# Patient Record
Sex: Female | Born: 1940
Health system: Southern US, Community
[De-identification: ages and names within clinical notes are randomized; demographics above are authoritative.]

## PROBLEM LIST (undated history)

## (undated) DIAGNOSIS — N289 Disorder of kidney and ureter, unspecified: Secondary | ICD-10-CM

---

## 2005-03-18 ENCOUNTER — Ambulatory Visit: Payer: Self-pay | Admitting: Nurse Practitioner

## 2005-03-25 ENCOUNTER — Ambulatory Visit: Payer: Self-pay | Admitting: Nurse Practitioner

## 2005-03-30 ENCOUNTER — Ambulatory Visit: Payer: Self-pay | Admitting: *Deleted

## 2005-04-05 ENCOUNTER — Ambulatory Visit (HOSPITAL_COMMUNITY): Admission: RE | Admit: 2005-04-05 | Discharge: 2005-04-05 | Payer: Self-pay | Admitting: Family Medicine

## 2005-10-25 ENCOUNTER — Ambulatory Visit: Payer: Self-pay | Admitting: Nurse Practitioner

## 2005-11-15 ENCOUNTER — Ambulatory Visit: Payer: Self-pay | Admitting: Nurse Practitioner

## 2005-11-18 ENCOUNTER — Ambulatory Visit: Payer: Self-pay | Admitting: Nurse Practitioner

## 2008-11-01 ENCOUNTER — Ambulatory Visit: Payer: Self-pay | Admitting: Family Medicine

## 2008-11-01 ENCOUNTER — Encounter (INDEPENDENT_AMBULATORY_CARE_PROVIDER_SITE_OTHER): Payer: Self-pay | Admitting: Internal Medicine

## 2008-11-01 LAB — CONVERTED CEMR LAB
ALT: 9 units/L (ref 0–35)
AST: 14 units/L (ref 0–37)
Albumin: 4.3 g/dL (ref 3.5–5.2)
Alkaline Phosphatase: 73 units/L (ref 39–117)
Basophils Absolute: 0 10*3/uL (ref 0.0–0.1)
Basophils Relative: 1 % (ref 0–1)
CO2: 26 meq/L (ref 19–32)
Calcium: 9.6 mg/dL (ref 8.4–10.5)
Cholesterol: 205 mg/dL — ABNORMAL HIGH (ref 0–200)
Eosinophils Absolute: 0.1 10*3/uL (ref 0.0–0.7)
Glucose, Bld: 90 mg/dL (ref 70–99)
Hemoglobin: 13.1 g/dL (ref 12.0–15.0)
Lymphocytes Relative: 49 % — ABNORMAL HIGH (ref 12–46)
Lymphs Abs: 2.5 10*3/uL (ref 0.7–4.0)
MCHC: 32.3 g/dL (ref 30.0–36.0)
MCV: 86.8 fL (ref 78.0–100.0)
Neutro Abs: 2.3 10*3/uL (ref 1.7–7.7)
RBC: 4.68 M/uL (ref 3.87–5.11)
Sodium: 139 meq/L (ref 135–145)
Total Bilirubin: 0.6 mg/dL (ref 0.3–1.2)
Total Protein: 7.1 g/dL (ref 6.0–8.3)
VLDL: 19 mg/dL (ref 0–40)

## 2009-01-08 ENCOUNTER — Ambulatory Visit: Payer: Self-pay | Admitting: Internal Medicine

## 2009-04-30 ENCOUNTER — Ambulatory Visit: Payer: Self-pay | Admitting: Internal Medicine

## 2009-05-28 ENCOUNTER — Ambulatory Visit (HOSPITAL_COMMUNITY): Admission: RE | Admit: 2009-05-28 | Discharge: 2009-05-28 | Payer: Self-pay | Admitting: Internal Medicine

## 2009-05-28 ENCOUNTER — Ambulatory Visit: Payer: Self-pay | Admitting: Internal Medicine

## 2009-05-28 LAB — CONVERTED CEMR LAB
ALT: 13 units/L (ref 0–35)
Albumin: 1.7 g/dL — ABNORMAL LOW (ref 3.5–5.2)
Alkaline Phosphatase: 72 units/L (ref 39–117)
BUN: 45 mg/dL — ABNORMAL HIGH (ref 6–23)
Basophils Absolute: 0.1 10*3/uL (ref 0.0–0.1)
Basophils Relative: 1 % (ref 0–1)
Calcium: 7.6 mg/dL — ABNORMAL LOW (ref 8.4–10.5)
Eosinophils Absolute: 0.2 10*3/uL (ref 0.0–0.7)
Lymphocytes Relative: 44 % (ref 12–46)
Lymphs Abs: 2.7 10*3/uL (ref 0.7–4.0)
MCHC: 32.4 g/dL (ref 30.0–36.0)
Neutrophils Relative %: 47 % (ref 43–77)
Phosphorus: 5.2 mg/dL — ABNORMAL HIGH (ref 2.3–4.6)
Potassium: 3.7 meq/L (ref 3.5–5.3)
RBC: 3.8 M/uL — ABNORMAL LOW (ref 3.87–5.11)
Total Protein: 4.6 g/dL — ABNORMAL LOW (ref 6.0–8.3)

## 2009-05-29 ENCOUNTER — Ambulatory Visit (HOSPITAL_COMMUNITY): Admission: RE | Admit: 2009-05-29 | Discharge: 2009-05-29 | Payer: Self-pay | Admitting: Internal Medicine

## 2009-05-29 ENCOUNTER — Encounter (INDEPENDENT_AMBULATORY_CARE_PROVIDER_SITE_OTHER): Payer: Self-pay | Admitting: Internal Medicine

## 2009-05-29 ENCOUNTER — Ambulatory Visit: Payer: Self-pay | Admitting: Internal Medicine

## 2009-06-04 ENCOUNTER — Encounter: Payer: Self-pay | Admitting: Internal Medicine

## 2009-06-04 ENCOUNTER — Inpatient Hospital Stay (HOSPITAL_COMMUNITY): Admission: RE | Admit: 2009-06-04 | Discharge: 2009-06-11 | Payer: Self-pay | Admitting: Infectious Diseases

## 2009-06-04 ENCOUNTER — Ambulatory Visit: Payer: Self-pay | Admitting: Infectious Diseases

## 2009-06-06 ENCOUNTER — Encounter: Payer: Self-pay | Admitting: Internal Medicine

## 2009-06-11 ENCOUNTER — Encounter: Payer: Self-pay | Admitting: Internal Medicine

## 2009-06-11 DIAGNOSIS — D649 Anemia, unspecified: Secondary | ICD-10-CM | POA: Insufficient documentation

## 2009-06-11 DIAGNOSIS — N049 Nephrotic syndrome with unspecified morphologic changes: Secondary | ICD-10-CM | POA: Insufficient documentation

## 2009-06-11 DIAGNOSIS — E785 Hyperlipidemia, unspecified: Secondary | ICD-10-CM | POA: Insufficient documentation

## 2009-06-11 DIAGNOSIS — I10 Essential (primary) hypertension: Secondary | ICD-10-CM | POA: Insufficient documentation

## 2009-06-13 ENCOUNTER — Ambulatory Visit: Payer: Self-pay | Admitting: Infectious Diseases

## 2009-06-13 LAB — CONVERTED CEMR LAB
Basophils Relative: 0 % (ref 0–1)
Calcium: 8 mg/dL — ABNORMAL LOW (ref 8.4–10.5)
Chloride: 106 meq/L (ref 96–112)
Creatinine, Ser: 3.17 mg/dL — ABNORMAL HIGH (ref 0.40–1.20)
Eosinophils Relative: 0 % (ref 0–5)
Lymphocytes Relative: 11 % — ABNORMAL LOW (ref 12–46)
MCHC: 32.2 g/dL (ref 30.0–36.0)
MCV: 87.1 fL (ref 78.0–100.0)
Monocytes Absolute: 0.2 10*3/uL (ref 0.1–1.0)
Neutro Abs: 9 10*3/uL — ABNORMAL HIGH (ref 1.7–7.7)
Potassium: 2.9 meq/L — ABNORMAL LOW (ref 3.5–5.3)
RDW: 15.5 % (ref 11.5–15.5)
WBC: 10.4 10*3/uL (ref 4.0–10.5)

## 2009-06-17 ENCOUNTER — Ambulatory Visit: Payer: Self-pay | Admitting: Internal Medicine

## 2009-06-17 LAB — CONVERTED CEMR LAB
BUN: 60 mg/dL — ABNORMAL HIGH (ref 6–23)
CO2: 23 meq/L (ref 19–32)
Hemoglobin: 10.5 g/dL — ABNORMAL LOW (ref 12.0–15.0)
Lymphocytes Relative: 12 % (ref 12–46)
Lymphs Abs: 1.2 10*3/uL (ref 0.7–4.0)
MCV: 85.8 fL (ref 78.0–100.0)
Monocytes Absolute: 0.5 10*3/uL (ref 0.1–1.0)
Monocytes Relative: 5 % (ref 3–12)
Neutrophils Relative %: 83 % — ABNORMAL HIGH (ref 43–77)
Platelets: 344 10*3/uL (ref 150–400)
RBC: 3.63 M/uL — ABNORMAL LOW (ref 3.87–5.11)
Sodium: 140 meq/L (ref 135–145)
WBC: 10 10*3/uL (ref 4.0–10.5)

## 2009-06-25 ENCOUNTER — Ambulatory Visit: Payer: Self-pay | Admitting: Internal Medicine

## 2009-06-25 LAB — CONVERTED CEMR LAB
Eosinophils Absolute: 0 10*3/uL (ref 0.0–0.7)
HCT: 34.8 % — ABNORMAL LOW (ref 36.0–46.0)
Lymphocytes Relative: 9 % — ABNORMAL LOW (ref 12–46)
MCV: 87.4 fL (ref 78.0–100.0)
Monocytes Absolute: 0.3 10*3/uL (ref 0.1–1.0)
Neutro Abs: 8.7 10*3/uL — ABNORMAL HIGH (ref 1.7–7.7)
RBC: 3.98 M/uL (ref 3.87–5.11)
Retic Ct Pct: 1.5 % (ref 0.4–3.1)
WBC: 9.9 10*3/uL (ref 4.0–10.5)

## 2009-07-03 ENCOUNTER — Ambulatory Visit: Payer: Self-pay | Admitting: Internal Medicine

## 2009-12-03 ENCOUNTER — Ambulatory Visit: Payer: Self-pay | Admitting: Internal Medicine

## 2011-02-04 IMAGING — US US RENAL
1 series · 14 of 25 positions shown · non-contrast
Comparison: None

CLINICAL DATA: Acute renal failure

RENAL/URINARY TRACT ULTRASOUND COMPLETE

[Series 1: us renal · 0.28mm/px · 14 of 26 slices shown]
[im 1/26]
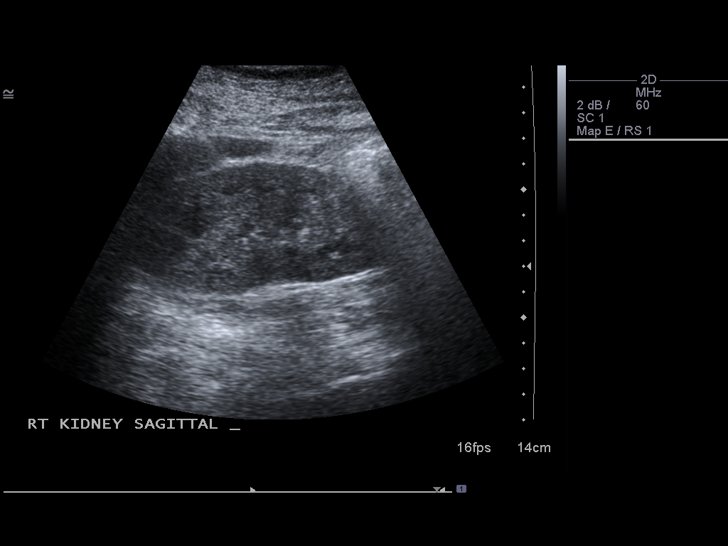
[im 3/26]
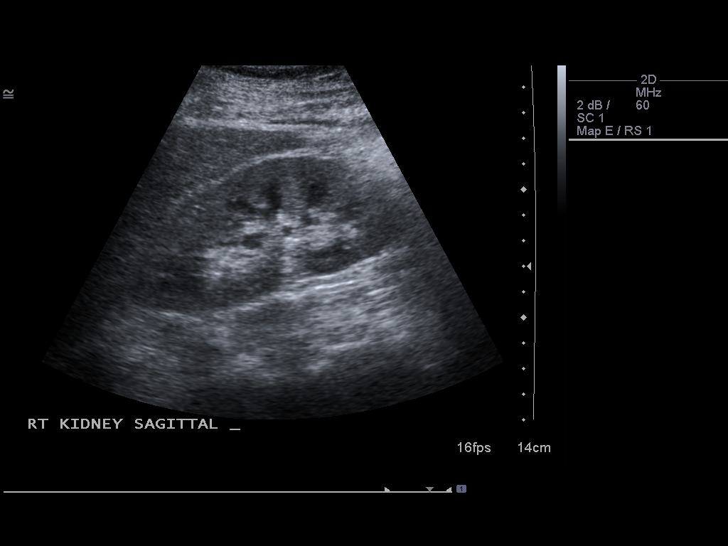
[im 5/26]
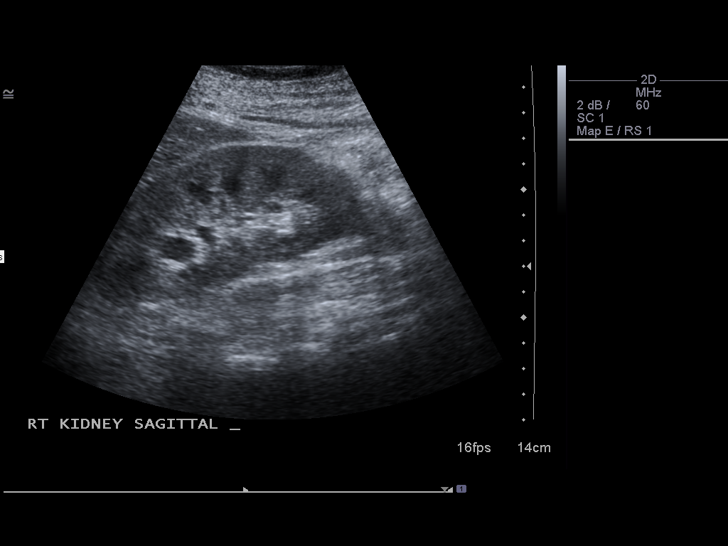
[im 7/26]
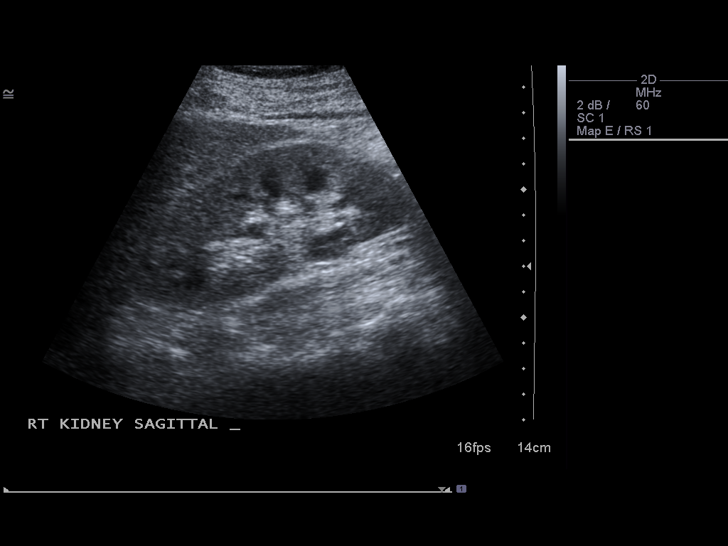
[im 9/26]
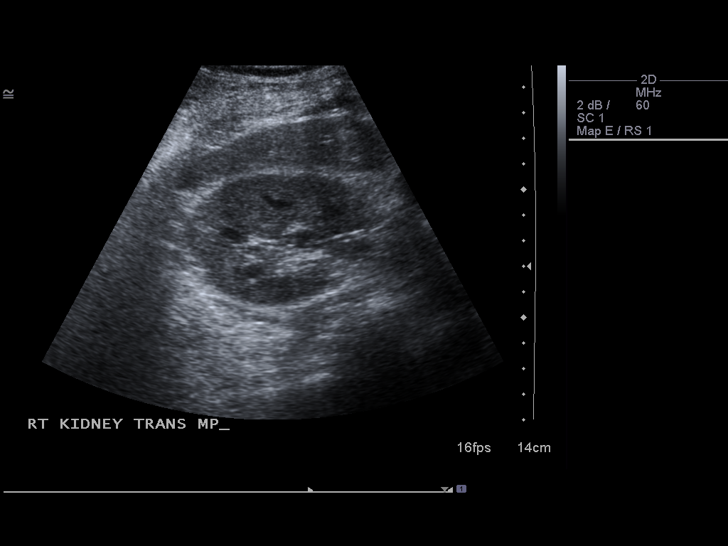
[im 10/26]
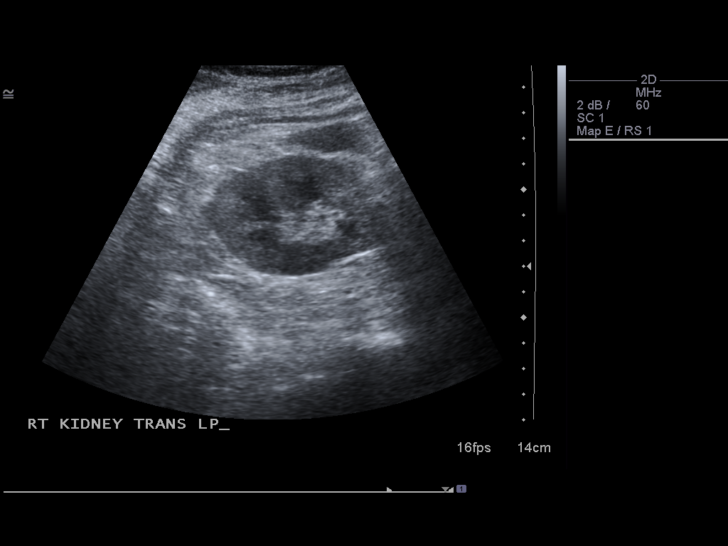
[im 12/26]
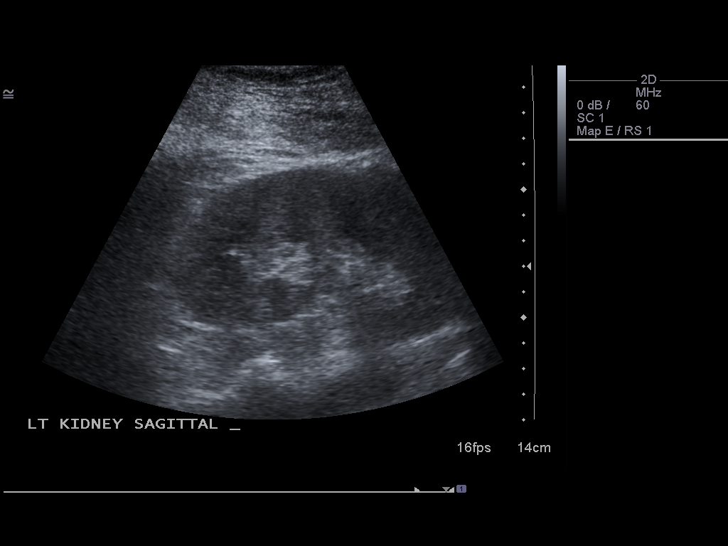
[im 14/26]
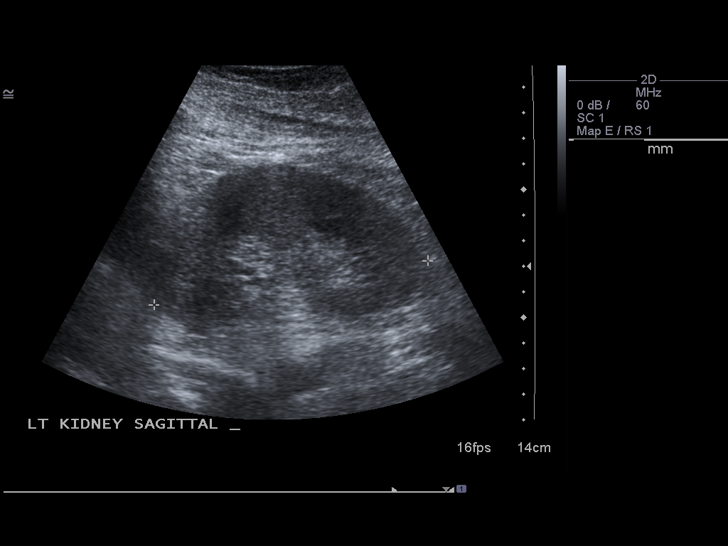
[im 16/26]
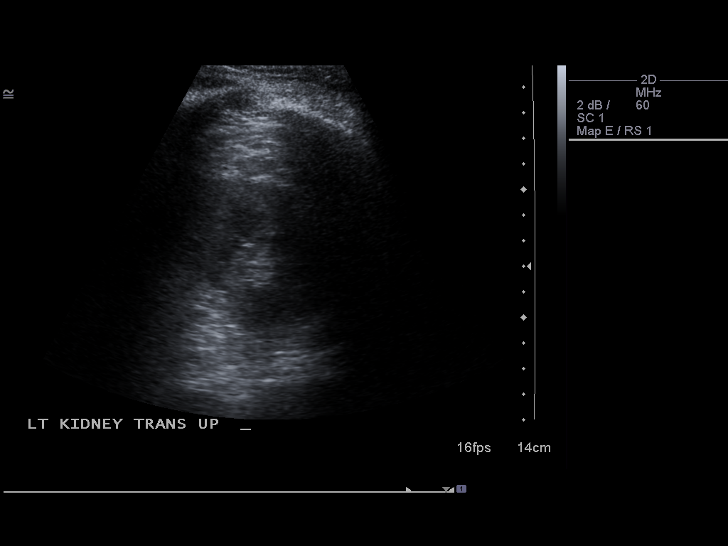
[im 17/26]
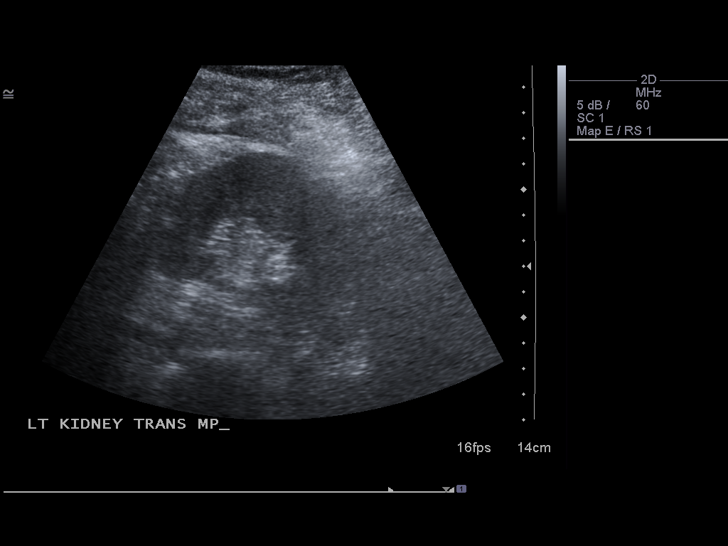
[im 19/26]
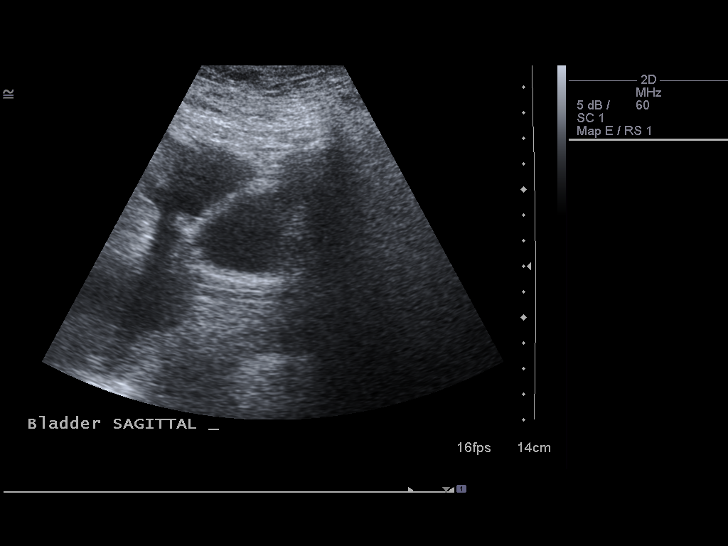
[im 21/26]
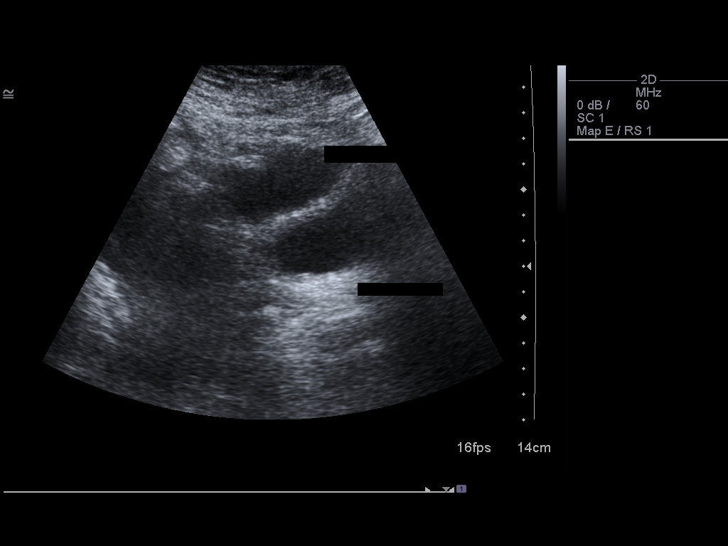
[im 23/26]
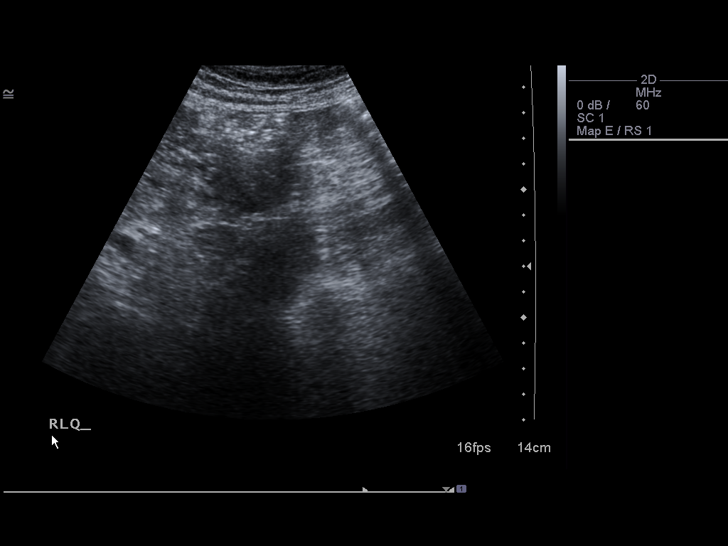
[im 26/26]
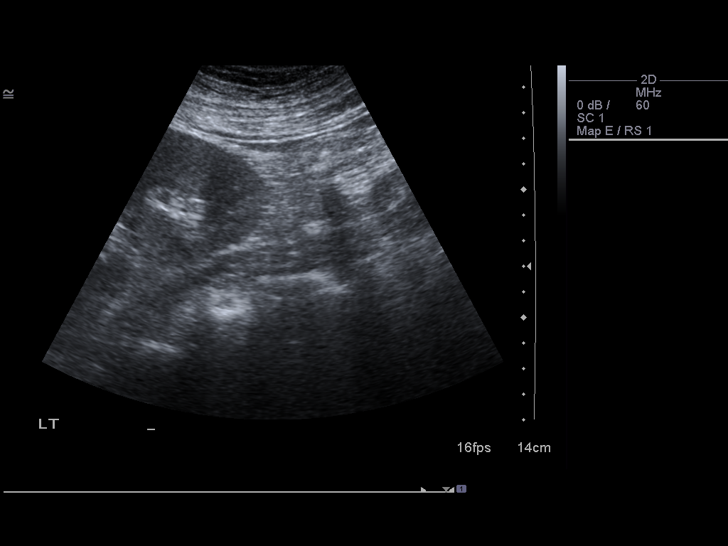

[14 of 25 positions shown; findings below may reference images not displayed]

FINDINGS: Right Kidney:  11.3 cm in length.  Slightly increased echogenicity.
No cyst, mass, stone or hydronephrosis.

Left Kidney:  10.9 cm in length.  Slightly increased echogenicity.
No focal lesion.

Bladder:  Normal

Small amount of free fluid in the pelvis.
IMPRESSION: The kidneys are slightly echogenic and perhaps minimally enlarged.
The findings suggest acute nephritis.  There is no focal lesion or
obstruction.

## 2011-03-22 LAB — CBC
HCT: 26.4 % — ABNORMAL LOW (ref 36.0–46.0)
HCT: 26.4 % — ABNORMAL LOW (ref 36.0–46.0)
HCT: 26.4 % — ABNORMAL LOW (ref 36.0–46.0)
HCT: 27.7 % — ABNORMAL LOW (ref 36.0–46.0)
HCT: 28 % — ABNORMAL LOW (ref 36.0–46.0)
HCT: 30.3 % — ABNORMAL LOW (ref 36.0–46.0)
Hemoglobin: 10.3 g/dL — ABNORMAL LOW (ref 12.0–15.0)
Hemoglobin: 9 g/dL — ABNORMAL LOW (ref 12.0–15.0)
Hemoglobin: 9.3 g/dL — ABNORMAL LOW (ref 12.0–15.0)
Hemoglobin: 9.3 g/dL — ABNORMAL LOW (ref 12.0–15.0)
Hemoglobin: 9.4 g/dL — ABNORMAL LOW (ref 12.0–15.0)
MCHC: 33.1 g/dL (ref 30.0–36.0)
MCHC: 33.7 g/dL (ref 30.0–36.0)
MCHC: 33.7 g/dL (ref 30.0–36.0)
MCHC: 33.8 g/dL (ref 30.0–36.0)
MCHC: 33.9 g/dL (ref 30.0–36.0)
MCHC: 34 g/dL (ref 30.0–36.0)
MCV: 85.3 fL (ref 78.0–100.0)
MCV: 85.5 fL (ref 78.0–100.0)
MCV: 85.7 fL (ref 78.0–100.0)
MCV: 85.8 fL (ref 78.0–100.0)
MCV: 85.8 fL (ref 78.0–100.0)
Platelets: 247 10*3/uL (ref 150–400)
Platelets: 292 10*3/uL (ref 150–400)
Platelets: 307 10*3/uL (ref 150–400)
RBC: 3.07 MIL/uL — ABNORMAL LOW (ref 3.87–5.11)
RBC: 3.1 MIL/uL — ABNORMAL LOW (ref 3.87–5.11)
RBC: 3.2 MIL/uL — ABNORMAL LOW (ref 3.87–5.11)
RBC: 3.23 MIL/uL — ABNORMAL LOW (ref 3.87–5.11)
RBC: 3.28 MIL/uL — ABNORMAL LOW (ref 3.87–5.11)
RBC: 3.57 MIL/uL — ABNORMAL LOW (ref 3.87–5.11)
RDW: 15.6 % — ABNORMAL HIGH (ref 11.5–15.5)
RDW: 15.6 % — ABNORMAL HIGH (ref 11.5–15.5)
RDW: 15.7 % — ABNORMAL HIGH (ref 11.5–15.5)
RDW: 15.7 % — ABNORMAL HIGH (ref 11.5–15.5)
RDW: 15.8 % — ABNORMAL HIGH (ref 11.5–15.5)
WBC: 4.6 10*3/uL (ref 4.0–10.5)
WBC: 5.8 10*3/uL (ref 4.0–10.5)

## 2011-03-22 LAB — RENAL FUNCTION PANEL
Albumin: 1.1 g/dL — ABNORMAL LOW (ref 3.5–5.2)
Albumin: <1 g/dL — ABNORMAL LOW (ref 3.5–5.2)
BUN: 39 mg/dL — ABNORMAL HIGH (ref 6–23)
BUN: 41 mg/dL — ABNORMAL HIGH (ref 6–23)
BUN: 46 mg/dL — ABNORMAL HIGH (ref 6–23)
BUN: 53 mg/dL — ABNORMAL HIGH (ref 6–23)
CO2: 17 mEq/L — ABNORMAL LOW (ref 19–32)
CO2: 19 mEq/L (ref 19–32)
CO2: 20 mEq/L (ref 19–32)
CO2: 22 mEq/L (ref 19–32)
Calcium: 7.3 mg/dL — ABNORMAL LOW (ref 8.4–10.5)
Calcium: 7.5 mg/dL — ABNORMAL LOW (ref 8.4–10.5)
Calcium: 7.6 mg/dL — ABNORMAL LOW (ref 8.4–10.5)
Calcium: 7.6 mg/dL — ABNORMAL LOW (ref 8.4–10.5)
Calcium: 7.7 mg/dL — ABNORMAL LOW (ref 8.4–10.5)
Calcium: 7.9 mg/dL — ABNORMAL LOW (ref 8.4–10.5)
Chloride: 109 mEq/L (ref 96–112)
Chloride: 117 mEq/L — ABNORMAL HIGH (ref 96–112)
Creatinine, Ser: 2.95 mg/dL — ABNORMAL HIGH (ref 0.4–1.2)
Creatinine, Ser: 3.06 mg/dL — ABNORMAL HIGH (ref 0.4–1.2)
Creatinine, Ser: 3.08 mg/dL — ABNORMAL HIGH (ref 0.4–1.2)
Creatinine, Ser: 3.36 mg/dL — ABNORMAL HIGH (ref 0.4–1.2)
Creatinine, Ser: 3.51 mg/dL — ABNORMAL HIGH (ref 0.4–1.2)
GFR calc Af Amer: 16 mL/min — ABNORMAL LOW (ref >60)
GFR calc Af Amer: 18 mL/min — ABNORMAL LOW (ref >60)
GFR calc Af Amer: 18 mL/min — ABNORMAL LOW (ref >60)
GFR calc non Af Amer: 13 mL/min — ABNORMAL LOW (ref >60)
GFR calc non Af Amer: 15 mL/min — ABNORMAL LOW (ref >60)
Glucose, Bld: 112 mg/dL — ABNORMAL HIGH (ref 70–99)
Glucose, Bld: 133 mg/dL — ABNORMAL HIGH (ref 70–99)
Glucose, Bld: 97 mg/dL (ref 70–99)
Phosphorus: 4.9 mg/dL — ABNORMAL HIGH (ref 2.3–4.6)
Phosphorus: 6 mg/dL — ABNORMAL HIGH (ref 2.3–4.6)
Phosphorus: 6.8 mg/dL — ABNORMAL HIGH (ref 2.3–4.6)
Phosphorus: 7.2 mg/dL — ABNORMAL HIGH (ref 2.3–4.6)
Potassium: 3.3 mEq/L — ABNORMAL LOW (ref 3.5–5.1)
Potassium: 3.8 mEq/L (ref 3.5–5.1)
Sodium: 133 mEq/L — ABNORMAL LOW (ref 135–145)
Sodium: 137 mEq/L (ref 135–145)

## 2011-03-22 LAB — HAPTOGLOBIN: Haptoglobin: 118 mg/dL (ref 16–200)

## 2011-03-22 LAB — PROTEIN ELECTROPH W RFLX QUANT IMMUNOGLOBULINS
Albumin ELP: 43.7 % — ABNORMAL LOW (ref 55.8–66.1)
Beta 2: 10.3 % — ABNORMAL HIGH (ref 3.2–6.5)
Beta Globulin: 4.1 % — ABNORMAL LOW (ref 4.7–7.2)
Gamma Globulin: 12 % (ref 11.1–18.8)

## 2011-03-22 LAB — COMPREHENSIVE METABOLIC PANEL
ALT: <8 U/L (ref 0–35)
AST: 17 U/L (ref 0–37)
Albumin: <1 g/dL — ABNORMAL LOW (ref 3.5–5.2)
Alkaline Phosphatase: 59 U/L (ref 39–117)
Alkaline Phosphatase: 67 U/L (ref 39–117)
BUN: 38 mg/dL — ABNORMAL HIGH (ref 6–23)
Calcium: 7.6 mg/dL — ABNORMAL LOW (ref 8.4–10.5)
Calcium: 7.6 mg/dL — ABNORMAL LOW (ref 8.4–10.5)
Creatinine, Ser: 2.99 mg/dL — ABNORMAL HIGH (ref 0.4–1.2)
Creatinine, Ser: 3.12 mg/dL — ABNORMAL HIGH (ref 0.4–1.2)
GFR calc non Af Amer: 15 mL/min — ABNORMAL LOW (ref >60)
Potassium: 2.9 mEq/L — ABNORMAL LOW (ref 3.5–5.1)
Potassium: 3.4 mEq/L — ABNORMAL LOW (ref 3.5–5.1)
Total Protein: 4.1 g/dL — ABNORMAL LOW (ref 6.0–8.3)

## 2011-03-22 LAB — PROTIME-INR: Prothrombin Time: 13.9 seconds (ref 11.6–15.2)

## 2011-03-22 LAB — AMYLASE: Amylase: 78 U/L (ref 27–131)

## 2011-03-22 LAB — MAGNESIUM: Magnesium: 2.8 mg/dL — ABNORMAL HIGH (ref 1.5–2.5)

## 2011-03-22 LAB — URINALYSIS, ROUTINE W REFLEX MICROSCOPIC
Bilirubin Urine: NEGATIVE
Glucose, UA: 100 mg/dL — AB
Leukocytes, UA: NEGATIVE
Nitrite: NEGATIVE
Specific Gravity, Urine: 1.014 (ref 1.005–1.030)
pH: 6 (ref 5.0–8.0)

## 2011-03-22 LAB — ANTI-NEUTROPHIL ANTIBODY

## 2011-03-22 LAB — BASIC METABOLIC PANEL
BUN: 40 mg/dL — ABNORMAL HIGH (ref 6–23)
CO2: 20 mEq/L (ref 19–32)
Chloride: 114 mEq/L — ABNORMAL HIGH (ref 96–112)
Creatinine, Ser: 3.11 mg/dL — ABNORMAL HIGH (ref 0.4–1.2)
GFR calc Af Amer: 18 mL/min — ABNORMAL LOW (ref >60)
GFR calc Af Amer: 20 mL/min — ABNORMAL LOW (ref >60)
GFR calc non Af Amer: 16 mL/min — ABNORMAL LOW (ref >60)
Potassium: 3.7 mEq/L (ref 3.5–5.1)
Sodium: 139 mEq/L (ref 135–145)

## 2011-03-22 LAB — C4 COMPLEMENT: Complement C4, Body Fluid: 45 mg/dL (ref 16–47)

## 2011-03-22 LAB — LACTATE DEHYDROGENASE: LDH: 223 U/L (ref 94–250)

## 2011-03-22 LAB — UIFE/LIGHT CHAINS/TP QN, 24-HR UR
Albumin, U: DETECTED
Alpha 1, Urine: DETECTED — AB
Alpha 2, Urine: DETECTED — AB
Beta, Urine: DETECTED — AB
Total Protein, Urine: 430.5 mg/dL

## 2011-03-22 LAB — DIFFERENTIAL
Basophils Relative: 1 % (ref 0–1)
Eosinophils Absolute: 0.1 10*3/uL (ref 0.0–0.7)
Lymphocytes Relative: 39 % (ref 12–46)
Neutro Abs: 3.2 10*3/uL (ref 1.7–7.7)

## 2011-03-22 LAB — PROTEIN / CREATININE RATIO, URINE
Creatinine, Urine: 64.8 mg/dL
Protein Creatinine Ratio: 11.39 — ABNORMAL HIGH (ref 0.00–0.15)
Total Protein, Urine: 738 mg/dL

## 2011-03-22 LAB — GLOMERULAR BASEMENT MEMBRANE ANTIBODIES

## 2011-03-22 LAB — LIPID PANEL
LDL Cholesterol: 524 mg/dL — ABNORMAL HIGH (ref 0–99)
Triglycerides: 348 mg/dL — ABNORMAL HIGH (ref <150)
VLDL: 70 mg/dL — ABNORMAL HIGH (ref 0–40)

## 2011-03-22 LAB — URINE MICROSCOPIC-ADD ON

## 2011-03-22 LAB — RETICULOCYTES: Retic Count, Absolute: 39.7 10*3/uL (ref 19.0–186.0)

## 2011-03-22 LAB — HIV ANTIBODY (ROUTINE TESTING W REFLEX): HIV: NONREACTIVE

## 2011-03-22 LAB — IGG, IGA, IGM
IgG (Immunoglobin G), Serum: 460 mg/dL — ABNORMAL LOW (ref 694–1618)
IgM, Serum: 75 mg/dL (ref 60–263)

## 2011-03-22 LAB — PLATELET FUNCTION ASSAY: Collagen / Epinephrine: 97 seconds (ref 0–180)

## 2011-03-22 LAB — IRON AND TIBC: UIBC: 63 ug/dL

## 2011-03-22 LAB — HEPATITIS PANEL, ACUTE
HCV Ab: NEGATIVE
Hep A IgM: NEGATIVE

## 2011-03-22 LAB — APTT: aPTT: 37 seconds (ref 24–37)

## 2011-03-22 LAB — SEDIMENTATION RATE: Sed Rate: 117 mm/hr — ABNORMAL HIGH (ref 0–22)

## 2011-03-22 LAB — HEMOGLOBIN A1C: Mean Plasma Glucose: 88 mg/dL

## 2011-04-27 NOTE — Consult Note (Signed)
NAMEANDRIANNA, MANALANG NO.:  [id number]   MEDICAL RECORD NO.:  [id number]          PATIENT TYPE:  INP   LOCATION:  6733                         FACILITY:  [facility]   PHYSICIAN:  Dyke Maes, M.D.DATE OF BIRTH:  1941-07-14   DATE OF CONSULTATION:  06/05/2009  DATE OF DISCHARGE:                                 CONSULTATION   REFERRING PHYSICIAN:  Mick Sell, MD   REASON FOR CONSULTATION:  Acute renal failure, nephrotic syndrome.   HISTORY OF PRESENT ILLNESS:  This is a 70 year old African American  female with no previous medical history, who had the sudden onset of  peripheral edema approximately 5 weeks ago.  She was seen at [facility]  on Apr 30, 2009, and was started on Lasix.  Serum creatinine was 1.0,  albumin 1.9, and hemoglobin 12.4 at that time.  Her swelling persisted,  and she was seen again on May 28, 2009, at which time her serum  creatinine had risen to 3.2, and hemoglobin had decreased to 10.4.  She  underwent a renal ultrasound, which showed right kidney of 11.3 cm and  left kidney of 10.9 cm with slight increased echogenicity and no  hydronephrosis.  Because of the severity of her nephrotic syndrome and  acute renal failure, she was admitted yesterday.  She denies any history  of gross hematuria or obstructive-type symptoms.  There is no family  history of renal disease.  She does take Goody Powders 1-2 a day for the  last 30 years.  She denies any overt blood loss.  She denies any  systemic symptoms in the way of fevers, chills, sweats, arthritic  complaints, neuropathic symptoms, or skin rashes.   PAST MEDICAL HISTORY:  She is recently diagnosed with high blood  pressure on May 23, 2019, when she was seen because of the edema.  She  is status post hysterectomy and unilateral salpingo-oophorectomy.   ALLERGIES:  PENICILLIN causes hives.   MEDICATION:  Lasix 80 mg IV q.8 h.   SOCIAL HISTORY:  Half pack per day smoker for 30  years.  She denies  alcohol use.  She lives by herself in [place].   FAMILY HISTORY:  Mother is alive at age of 14, has Alzheimer's and COPD.  Father died of MI and diabetes.  No family history of renal disease.   REVIEW OF SYSTEMS:  Appetite has been decreased since the edema.  She  has had a weight gain of over 30 pounds because of the fluid.  She does  complain of some fatigue.  She has no chest pains or chest pressures, no  shortness of breath.  Rest of the review of systems as noted above.  She  denies any history of sore throat.  Rest of review of systems are  unremarkable.   PHYSICAL EXAMINATION:  VITAL SIGNS:  Blood pressure 120/77, pulse 77,  temperature 98.1.  GENERAL:  A 70 year old black female, in no acute distress.  HEENT:  Sclerae nonicteric.  Extraocular muscles are intact.  NECK:  No JVD.  No lymphadenopathy.  LUNGS:  Clear to auscultation.  HEART:  Regular  rate and rhythm without murmur, rub, or gallop.  ABDOMEN:  Positive bowel sounds, nontender, nondistended.  No  hepatosplenomegaly.  There is subcutaneous edema.  BACK:  Presacral edema.  EXTREMITIES:  A 4+ edema.  Pulses are not palpable distally because of  edema.  SKIN:  No rashes.  NEUROLOGIC:  Cranial nerves intact.  Motor and sensory intact.  No  asterixis.   LABORATORY DATA:  Sodium 140, potassium 3.0, bicarb 19, BUN 41,  creatinine 2.9.  Urinalysis shows 11-20 rbc's, 0-2 wbc's, positive for  protein.  Protein-creatinine ratio estimates at 11 g of protein per 24  hours.  T3 and T4 are normal.  Sed rate is 117.  Hemoglobin 9.3,  platelet count 247,000, white count 5.8.   IMPRESSION:  1. Nephrotic syndrome.  differential diagnosis includes minimal change      disease or membranous either idiopathic related to Goody's.  2. Possible dysproteinemia given the fact that her hemoglobin has      decreased significantly over the last month.  We will do serologic      workup to rule out other causes, but I  think the most expedient way      to get a diagnosis is a renal biopsy.   PLAN:  1. We will schedule for a renal biopsy tomorrow, and this has been      known through Radiology for 1:00 p.m.  2. IV Lasix 80 mg q.8 h.  We will continue that.  If she is not      diuresed quickly, increase this to 160 mg q.8 h.  3. We will replace her potassium.  4. We will follow her renal function daily.  5. Risks of the renal biopsy were explained to the patient, which      include a 1 in 10 chance of gross hematuria, 1 in 100 chance      requiring blood transfusion, 1 in 1000 chance requiring surgical      intervention to stop bleeding, 1 in 5000 chance of nephrectomy.      The patient understood all these risks and is agreeable to a renal      biopsy.   Thank you very much for the consult.  We will follow up the patient with  you.      Dyke Maes, M.D.     MTM/MEDQ  D:  06/05/2009  T:  06/06/2009  Job:  [id number]

## 2011-04-27 NOTE — Discharge Summary (Signed)
[name], [name] NO.:  [id number]   MEDICAL RECORD NO.:  [id number]          PATIENT TYPE:  INP   LOCATION:  6729                         FACILITY:  [facility]   PHYSICIAN:  Mick Sell, MD DATE OF BIRTH:  26-Apr-1941   DATE OF ADMISSION:  06/04/2009  DATE OF DISCHARGE:  06/11/2009                               DISCHARGE SUMMARY   DISCHARGE DIAGNOSES:  1. Nephrotic syndrome/renal failure, secondary to minimal change      disease with some component of acute tubular necrosis (as per      verbal preliminary report, and full report pending at the time of      discharge), to continue aggressive diuresis and prednisone therapy,      to follow up with Nephrology as an outpatient, to check basic      metabolic panel on June 13, 2009 and June 17, 2009.  2. Anemia most likely secondary to nephrotic syndrome and renal      failure, to receive Aranesp 100 mcg.  3. Hypertension, currently well controlled.  4. Hypercholesterolemia, secondary to nephrotic syndrome, starting      statin therapy.  5. Hyperphosphatemia secondary to acute renal failure, to take      Fosrenol.  6. Hypokalemia, secondary to Lasix administration, to receive      potassium supplementation.   DISCHARGE MEDICATIONS:  1. Furosemide 160 mg one pill four times a day.  2. Crestor 10 mg one pill once a day.  3. Aranesp 100 mcg subcutaneously every other week.  4. Hectorol 1 mcg one pill once a day.  5. Metolazone 10 mg one pill once a day.  6. Potassium chloride 40 mEq one pill twice a day.  7. Prednisone 60 mg one pill once a day, as per Renal recommendations,      follow up with Renal as an outpatient.  8. Fosrenol 1000 mcg one tablet chew three times a day.   DISPOSITION AND FOLLOWUP:  The patient is to follow up with Outpatient  Clinic at [facility] for the repeat basic metabolic panel on  June 13, 2009 at 11:30 a.m. in the Outpatient Clinic at [facility] and also with  [facility] on June 17, 2009 at 10:30 a.m.  The  patient is to follow up with Dr. Reche Dixon at [facility] on June 25, 2009  at 8:30 a.m.  The patient is to follow up with Dr. Deterding's PA on  July 07, 2009 at 4 p.m.  At the time of hospital followup, please  monitor the patient's weight log and make sure that the patient's weight  is gradually trending down, also examined her pedal edema and make  necessary changes in the Lasix as clinically needed.  Please follow up  on the full renal biopsy report that is pending at the time of  discharge.  Please check her basic metabolic panel and make sure her  potassium is okay.  Also make sure that the patient has a followup  appointment with Dr. Darrick Penna.   PROCEDURE PERFORMED:  Renal biopsy.  Verbal preliminary, minimal change  disease with some complaint  of ATN.  Full report is still pending at the  time of discharge.  ESR is 117, C3 complement 142, C4 complement 45, CRP  is 0.3, LDH is 223, urine creatinine 64.6.  Urinalysis is negative for  nitrites and leukocytes, positive for greater than 300 protein and urine  glucose is 100, hepatitis panel negative for hepatitis B surface antigen  and hepatitis A antibody, hepatitis C antibody.  Anemia panel; vitamin  B12 512, serum folate 5.0, serum ferritin 455.  Haptoglobin 118.  Lipid  panel; total cholesterol 624, LDL-cholesterol 524, triglycerides 348.  HIV antibody is nonreactive.  TSH is 2.8, A1c is 4.7, platelet function  is normal as.  Parathyroid hormone is 223.4, total calcium is 7.0.  Antinuclear antibody is negative.  Fecal occult blood test is negative.   CONSULTATIONS:  Nephrology (Dr. Darrick Penna).   BRIEF ADMITTING HISTORY AND PHYSICAL:  [name] is a very pleasant  African American lady who has a direct admission from [facility] for  renal failure and volume overload.  The patient states that about 2-3  weeks ago, she went to bed and woke up feeling heaviness all over and  bloated  feeling.  She relayed that she had diffuse swelling at that  time.  She called [facility] and schedule an appointment.  She was seen  there and again felt like her legs are progressively swelling in the ill  time.  She was noted to have elevated blood pressure and was given  Lasix.  Since then, she has had her Lasix increased without improvement  in edema.  She has noted swelling as far up to her thighs, arms, upper  chest, and neck.  Aside from early satiety, she denies any other  complaints.  She continues to have a good appetite.  She had a normal  mammogram in 2008 and a normal colonoscopy more than 10 years ago.   PHYSICAL EXAMINATION:  VITAL SIGNS:  Temperature 98.5, pulse rate of  102, blood pressure 141/90, respiratory 20, oxygen saturation 97% on  room air.  GENERAL:  The patient is alert and cooperative to examination.  Pupils  are equal, round, and reactive to light.  Extraocular movements are  intact.  Mouth, moist mucous membranes.  No exudates.  NECK:  Supple.  Full range of movements.  No thyromegaly.  JVD to the  middle neck and no carotid bruits.  LUNGS:  Normal respiratory effort and accessory muscle use.  Normal  breath sounds.  No crackles.  No rhonchi.  CVS:  S1 and S1, regular in rate and rhythm.  No murmurs. No rubs.  No  gallops.  ABDOMEN:  Soft, nontender, nondistended.  No guarding.  No rigidity.  No  hepatomegaly.  No splenomegaly.  Pulses 2+ distal pulses bilaterally.  EXTREMITIES:  No cyanosis, clubbing, 2 to 3+ pitting edema in lower  extremities up to the lower back and also present in the upper  extremities.  NEUROLOGIC:  The patient is alert and oriented x3.  Cranial nerves II-  XII are grossly intact.  Strength is normal in all four extremities.  Sensation is intact to light touch and gait is normal.  PSYCHIATRY:  Appropriate.   Her initial labs on the day of admission, CBC; white count 6.1,  hemoglobin 9.3 and platelet count 297.  Comprehensive  metabolic panel;  sodium 136, potassium 2.9, chloride 113, bicarb 18, glucose 87, BUN 42,  creatinine 3.12, bilirubin 0.2, alkaline phosphatase 67, AST 17, ALT  less than 8, total  protein 5.0, albumin 1.1, calcium 7.6.   HOSPITAL COURSE BY PROBLEMS:  1. Nephrotic syndrome.  Given the patient's history of generalized      edema, hypercholesterolemia, hypoalbuminemia, massive proteinuria,      clinically she missed the criteria for nephrotic syndrome.  We did      a complete workup for the etiology of nephrotic syndrome and the      hepatitis panel, rheumatological workup, and HIV, they all are      negative.  We consulted Nephrology and aggressively diuresed with      Lasix 160 mg q.6 h.  The patient had a renal biopsy done on June 06, 2009 as recommended by Dr. deterring.  Although, the patient      was initially started on massive amounts of furosemide, the      patient's total output during the few days of the admission was      less 500 mL.  At which point, we added Zaroxolyn to the Lasix.  The      patient had net negative balance of 670 mL on June 09, 2009 and 920      mL on June 10, 2009.  The patient is to continue Lasix 160 mg q.6      hourly along with Zaroxolyn 10 mg once a day and is to follow up      with Outpatient Clinic at [facility] on June 13, 2009 and      with [facility] on June 17, 2009 for the repeat basic metabolic      panel to make sure that the potassium is okay and also is to follow      up with [facility] and [facility] as an      outpatient.  The patient's renal biopsy preliminary report came      back minimal change disease with some complaint of acute tubular      necrosis.  We started empirically with prednisone on June 06, 2009      and the patient is to continue prednisone for at least 6 months and      is to follow up with [facility] and as per      Nephrology recommendation, prednisone doses needs to be  determined.      The patient on the day of discharge is advised to check her body      weights every day and needs to bring the weight log to her office      visits.  2. Hypertension.  The patient's blood pressure has been fairly well      controlled with systolics between 120s to 140s.  Given aggressive      diuresis with Zaroxolyn and furosemide, we did not start any      medications for the hypertension.  The patient is to follow up with      [facility] as an outpatient.  3. Anemia.  The patient's hemoglobin has been stable and most likely      her anemia is secondary to the acute renal failure and nephrotic      syndrome.  The patient is to receive Aranesp 100 mcg subcutaneous      injection every other week and is to follow up with [facility] as      an outpatient for monitoring the CBC.  The patient's fecal occult      blood test on the day of discharge came back negative.  4. Hyperphosphatemia, most likely secondary to her renal failure.  The      patient is to receive Fosrenol as mentioned in the discharge      instructions and is to follow up with Nephrology as an outpatient.  5. Hyperlipidemia.  The patient's LDL is 524 and we started her on      Crestor 10 mg.  The patient is to follow up with [facility] as an      outpatient and to get a repeat fasting lipid panel in 3 months.  6. Hypokalemia secondary to diuretic therapy, the patient is to take      potassium supplementation as mentioned in the discharge      instructions and needs to check her basic metabolic panel on June 13, 2009 and June 17, 2009 as mentioned in the instructions.   DISCHARGE VITAL SIGNS:  Temperature 97.8, pulse rate of 80, respiratory  rate 12, blood pressure 136/81, oxygen saturation 99% on room air.   DISCHARGE LABS:  CBC; white count 9.2, hemoglobin 8.9, platelet count  332.  Renal function panel; sodium 133, potassium 3.1 (repleted),  chloride 107, bicarb 22, glucose 97, BUN 54, creatinine  3.06, albumin  less than 1.0, calcium 7.3, phosphorus 4.9, magnesium 2.5.  Fecal occult  blood test is negative.  The patient on the day of discharge is alert  and oriented x3, stable and is not in any acute distress.  The patient  is to follow up with Outpatient Clinic as well as with [facility] for  the basic metabolic panel check on June 13, 2009 and June 17, 2009 and is  to follow up with [facility] as well as the [facility]  on the dates mentioned in the discharge instructions.      Blondell Reveal, MD  Electronically Signed      Mick Sell, MD  Electronically Signed    VB/MEDQ  D:  06/11/2009  T:  06/11/2009  Job:  [id number]   cc:   Outpatient Clinic, [facility]  Dineen Kid. Reche Dixon, M.D.  Sanjuana Mae, MD

## 2012-05-16 DIAGNOSIS — N032 Chronic nephritic syndrome with diffuse membranous glomerulonephritis: Secondary | ICD-10-CM | POA: Diagnosis not present

## 2012-05-16 DIAGNOSIS — F17200 Nicotine dependence, unspecified, uncomplicated: Secondary | ICD-10-CM | POA: Diagnosis not present

## 2012-05-16 DIAGNOSIS — I10 Essential (primary) hypertension: Secondary | ICD-10-CM | POA: Diagnosis not present

## 2012-05-16 DIAGNOSIS — E785 Hyperlipidemia, unspecified: Secondary | ICD-10-CM | POA: Diagnosis not present

## 2012-05-16 DIAGNOSIS — E213 Hyperparathyroidism, unspecified: Secondary | ICD-10-CM | POA: Diagnosis not present

## 2012-05-19 DIAGNOSIS — N390 Urinary tract infection, site not specified: Secondary | ICD-10-CM | POA: Diagnosis not present

## 2013-01-08 ENCOUNTER — Emergency Department (HOSPITAL_COMMUNITY)
Admission: EM | Admit: 2013-01-08 | Discharge: 2013-01-08 | Disposition: A | Discharge status: Home / Self Care | Payer: Medicare Other | Attending: Emergency Medicine | Admitting: Emergency Medicine

## 2013-01-08 ENCOUNTER — Encounter (HOSPITAL_COMMUNITY): Payer: Self-pay | Admitting: Emergency Medicine

## 2013-01-08 ENCOUNTER — Emergency Department (HOSPITAL_COMMUNITY): Payer: Medicare Other

## 2013-01-08 DIAGNOSIS — R3915 Urgency of urination: Secondary | ICD-10-CM | POA: Diagnosis not present

## 2013-01-08 DIAGNOSIS — N12 Tubulo-interstitial nephritis, not specified as acute or chronic: Secondary | ICD-10-CM | POA: Insufficient documentation

## 2013-01-08 DIAGNOSIS — F17200 Nicotine dependence, unspecified, uncomplicated: Secondary | ICD-10-CM | POA: Diagnosis not present

## 2013-01-08 DIAGNOSIS — N10 Acute tubulo-interstitial nephritis: Secondary | ICD-10-CM | POA: Diagnosis not present

## 2013-01-08 DIAGNOSIS — R509 Fever, unspecified: Secondary | ICD-10-CM | POA: Insufficient documentation

## 2013-01-08 DIAGNOSIS — K7689 Other specified diseases of liver: Secondary | ICD-10-CM | POA: Diagnosis not present

## 2013-01-08 LAB — BASIC METABOLIC PANEL
BUN: 9 mg/dL (ref 6–23)
Creatinine, Ser: 0.75 mg/dL (ref 0.50–1.10)
GFR calc non Af Amer: 84 mL/min — ABNORMAL LOW (ref >90)
Glucose, Bld: 109 mg/dL — ABNORMAL HIGH (ref 70–99)
Potassium: 3.9 mEq/L (ref 3.5–5.1)

## 2013-01-08 LAB — CBC WITH DIFFERENTIAL/PLATELET
Eosinophils Absolute: 0.1 10*3/uL (ref 0.0–0.7)
Eosinophils Relative: 1 % (ref 0–5)
HCT: 41.3 % (ref 36.0–46.0)
Hemoglobin: 13.7 g/dL (ref 12.0–15.0)
Lymphs Abs: 2.9 10*3/uL (ref 0.7–4.0)
MCH: 28 pg (ref 26.0–34.0)
MCV: 84.5 fL (ref 78.0–100.0)
Monocytes Absolute: 0.3 10*3/uL (ref 0.1–1.0)
Monocytes Relative: 5 % (ref 3–12)
Neutrophils Relative %: 50 % (ref 43–77)
RBC: 4.89 MIL/uL (ref 3.87–5.11)

## 2013-01-08 LAB — URINE MICROSCOPIC-ADD ON

## 2013-01-08 LAB — URINALYSIS, ROUTINE W REFLEX MICROSCOPIC
Glucose, UA: NEGATIVE mg/dL
Specific Gravity, Urine: 1.022 (ref 1.005–1.030)

## 2013-01-08 MED ORDER — SULFAMETHOXAZOLE-TRIMETHOPRIM 800-160 MG PO TABS: 1.0000 | ORAL_TABLET | Freq: Two times a day (BID) | ORAL | Status: AC

## 2013-01-08 MED ORDER — SULFAMETHOXAZOLE-TMP DS 800-160 MG PO TABS
1.0000 | ORAL_TABLET | Freq: Once | ORAL | Status: AC
  Administered 2013-01-08: 1 via ORAL
  Filled 2013-01-08: qty 1

## 2013-01-08 MED ORDER — ASPIRIN 81 MG PO CHEW
324.0000 mg | CHEWABLE_TABLET | Freq: Once | ORAL | Status: AC
  Administered 2013-01-08: 324 mg via ORAL
  Filled 2013-01-08: qty 4

## 2013-01-08 MED ORDER — TRAMADOL HCL 50 MG PO TABS: 50.0000 mg | ORAL_TABLET | Freq: Four times a day (QID) | ORAL | Status: AC | PRN

## 2013-01-08 NOTE — ED Notes (Signed)
Pt statets that she has back pain  Rt side no injury she states  Pain comes and goes

## 2013-01-08 NOTE — ED Provider Notes (Signed)
History     CSN: [id number]  Arrival date & time 01/08/13  1117   First MD Initiated Contact with Patient 01/08/13 1257      Chief Complaint  Patient presents with  . Back Pain    (Consider location/radiation/quality/duration/timing/severity/associated sxs/prior treatment) HPI 72 year old woman presents to ED complaining of right sided flank pain x 3 days. Pain radiates from groin up her right side, is sharp, comes and goes, rated 10/10 at worst. No history of prior kidney stones. Reports urinary urgency when pain intensifies with no burning. Had fever 3 nights ago. Denies nausea, vomiting, stomach pain, changes in bowel habits. She has history of renal insufficiency that was close to requiring dialysis but resolved.    History reviewed. No pertinent past medical history.  No past surgical history on file.  No family history on file.  History  Substance Use Topics  . Smoking status: Current Every Day Smoker  . Smokeless tobacco: Not on file  . Alcohol Use: No    OB History    Grav Para Term Preterm Abortions TAB SAB Ect Mult Living                  Review of Systems  Constitutional: Positive for fever.  Respiratory: Negative for chest tightness and shortness of breath.   Cardiovascular: Negative for chest pain.  Gastrointestinal: Negative for nausea, vomiting, abdominal pain, diarrhea and constipation.  Genitourinary: Positive for urgency.  Musculoskeletal: Negative for back pain.       Right sided flank pain    Allergies  Penicillins  Home Medications   Current Outpatient Rx  Name  Route  Sig  Dispense  Refill  . ASPIRIN 325 MG PO TABS   Oral   Take 975 mg by mouth daily. For back pain.           BP 135/76  Pulse 89  Temp 98.5 F (36.9 C)  SpO2 99%  Physical Exam  Nursing note and vitals reviewed. Constitutional: She is oriented to person, place, and time. She appears well-developed and well-nourished. No distress.  HENT:  Head: Normocephalic  and atraumatic.  Mouth/Throat: Oropharynx is clear and moist.  Eyes: Conjunctivae normal and EOM are normal. Pupils are equal, round, and reactive to light.  Neck: Normal range of motion.  Cardiovascular: Normal rate, regular rhythm and normal heart sounds.   Pulmonary/Chest: Effort normal and breath sounds normal. No stridor. No respiratory distress. She has no wheezes. She has no rales. She exhibits no tenderness.  Abdominal: Soft. Bowel sounds are normal. She exhibits no distension and no mass. There is no tenderness. There is CVA tenderness. There is no rebound and no guarding.  Musculoskeletal: Normal range of motion.       Right shoulder: She exhibits no tenderness and no bony tenderness.  Neurological: She is alert and oriented to person, place, and time.  Psychiatric: She has a normal mood and affect.    ED Course  Procedures (including critical care time)  Labs Reviewed  URINALYSIS, ROUTINE W REFLEX MICROSCOPIC - Abnormal; Notable for the following:    APPearance HAZY (*)     Hgb urine dipstick SMALL (*)     Bilirubin Urine SMALL (*)     Ketones, ur 15 (*)     Leukocytes, UA MODERATE (*)     All other components within normal limits  URINE MICROSCOPIC-ADD ON - Abnormal; Notable for the following:    Squamous Epithelial / LPF FEW (*)  Bacteria, UA MANY (*)     All other components within normal limits  URINE CULTURE   Ct Abdomen Pelvis Wo Contrast  01/08/2013  *RADIOLOGY REPORT*  Clinical Data: Right flank pain, decreased function of left kidney  CT ABDOMEN AND PELVIS WITHOUT CONTRAST  Technique:  Multidetector CT imaging of the abdomen and pelvis was performed following the standard protocol without intravenous contrast.  Comparison: None.  Findings: Mild linear scarring in the lingula and right middle lobe.  7 mm probable cyst in the left hepatic lobe (series 2/image 9).  At least two additional hypoenhancing lesions in the right hepatic lobe measuring up to 1.4 cm (series  2/images 7 and 18), statistically likely benign in the absence of known malignancy, although incompletely characterized.  Spleen, pancreas, and adrenal glands are within normal limits.  Gallbladder is unremarkable.  No intrahepatic or extrahepatic ductal dilatation.  Kidneys are unremarkable.  No renal calculi or hydronephrosis.  No evidence of bowel obstruction.  Atherosclerotic calcifications of the abdominal aorta and branch vessels.  No abdominopelvic ascites.  No suspicious abdominopelvic lymphadenopathy.  Status post hysterectomy.  No adnexal masses.  No ureteral or bladder calculi.  Numerous calcified pelvic phleboliths.  Degenerative changes of the visualized thoracolumbar spine.  IMPRESSION: No renal, ureteral, or bladder calculi.  No hydronephrosis.  No CT findings to account for the patient's abdominal pain.  1.4 cm hypoenhancing lesion in the right hepatic dome, likely benign, although incompletely characterized.  If further characterization is required, consider MRI abdomen (with/without contrast if GFR is above 30) in 6 months.  This recommendation follows ACR consensus guidelines:  Managing Incidental Findings on Abdominal CT:  White Paper of the ACR Incidental Findings Committee.  J Am Coll Radiol 2010;7:754-773.   Original Report Authenticated By: Charline Bills, M.D.      1. Pyelonephritis       MDM  Pattern radiation of pain are consistent with kidney stone. Urinalysis shows many bacteria with leukocytes and no nitrites. There is a small amount of hemoglobin I will scan her to make sure she does not have an infected stone. There is no leukocytosis or AKI.   Pt verbalized understanding and agrees with care plan. Outpatient follow-up and return precautions given.    New Prescriptions   SULFAMETHOXAZOLE-TRIMETHOPRIM (SEPTRA DS) 800-160 MG PER TABLET    Take 1 tablet by mouth every 12 (twelve) hours.   TRAMADOL (ULTRAM) 50 MG TABLET    Take 1 tablet (50 mg total) by mouth every 6  (six) hours as needed for pain.          Wynetta Emery, PA-C 01/09/13 [phone]

## 2013-01-08 NOTE — ED Notes (Signed)
Care transferred and report given to Wendy, RN. 

## 2013-01-09 LAB — URINE CULTURE

## 2013-01-09 NOTE — ED Provider Notes (Signed)
Medical screening examination/treatment/procedure(s) were performed by non-physician practitioner and as supervising physician I was immediately available for consultation/collaboration.   Carleene Cooper III, MD 01/09/13 [phone]

## 2013-02-12 DIAGNOSIS — N032 Chronic nephritic syndrome with diffuse membranous glomerulonephritis: Secondary | ICD-10-CM | POA: Diagnosis not present

## 2013-02-12 DIAGNOSIS — Z23 Encounter for immunization: Secondary | ICD-10-CM | POA: Diagnosis not present

## 2013-02-12 DIAGNOSIS — N059 Unspecified nephritic syndrome with unspecified morphologic changes: Secondary | ICD-10-CM | POA: Diagnosis not present

## 2013-02-12 DIAGNOSIS — D649 Anemia, unspecified: Secondary | ICD-10-CM | POA: Diagnosis not present

## 2013-02-12 DIAGNOSIS — I10 Essential (primary) hypertension: Secondary | ICD-10-CM | POA: Diagnosis not present

## 2013-02-12 DIAGNOSIS — E785 Hyperlipidemia, unspecified: Secondary | ICD-10-CM | POA: Diagnosis not present

## 2014-03-07 DIAGNOSIS — E785 Hyperlipidemia, unspecified: Secondary | ICD-10-CM | POA: Diagnosis not present

## 2014-03-07 DIAGNOSIS — N032 Chronic nephritic syndrome with diffuse membranous glomerulonephritis: Secondary | ICD-10-CM | POA: Diagnosis not present

## 2014-03-07 DIAGNOSIS — I129 Hypertensive chronic kidney disease with stage 1 through stage 4 chronic kidney disease, or unspecified chronic kidney disease: Secondary | ICD-10-CM | POA: Diagnosis not present

## 2014-03-07 DIAGNOSIS — N179 Acute kidney failure, unspecified: Secondary | ICD-10-CM | POA: Diagnosis not present

## 2014-09-16 IMAGING — CT CT ABD-PELV W/O CM
2 of 4 series · 14 of 32 positions shown, 19 images · non-contrast
Comparison: None.

CLINICAL DATA: Right flank pain, decreased function of left kidney

CT ABDOMEN AND PELVIS WITHOUT CONTRAST
TECHNIQUE: Multidetector CT imaging of the abdomen and pelvis was
performed following the standard protocol without intravenous
contrast.

[Series 2: renal stone · axial · 0.70mm/px · z∈[-434,-159]mm · 7 of 75 slices shown, 12 images]
[im 10/75  soft-tissue]
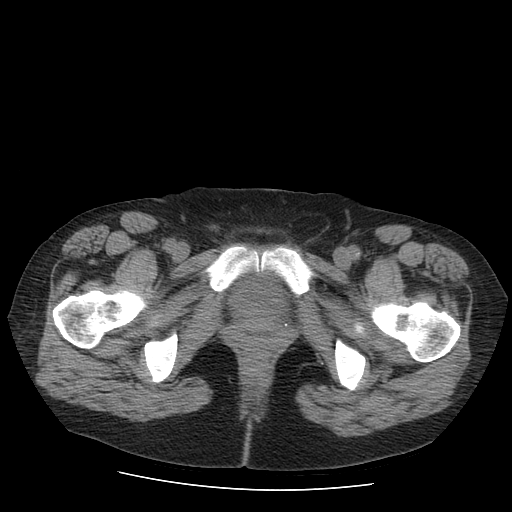
[im 10/75  bone]
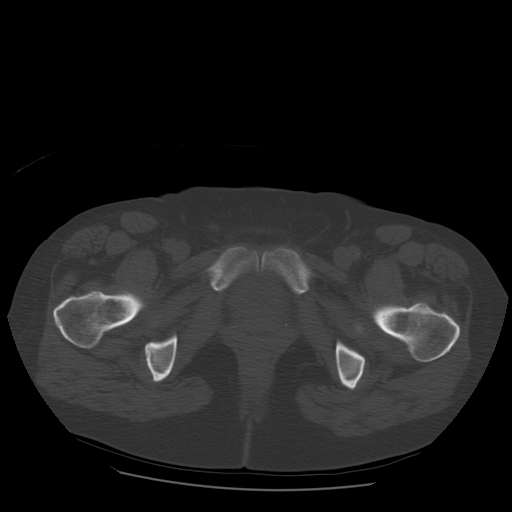
[im 19/75  soft-tissue]
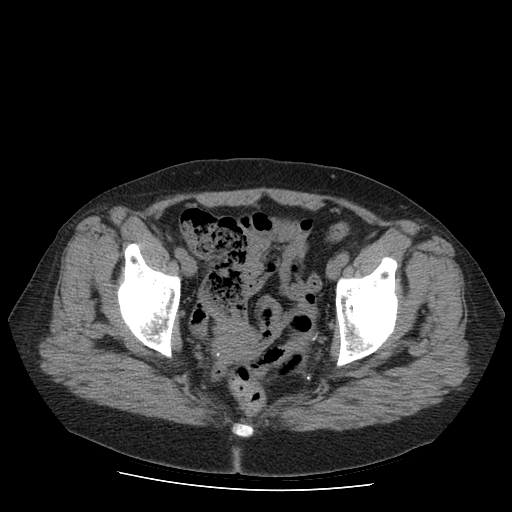
[im 28/75  soft-tissue]
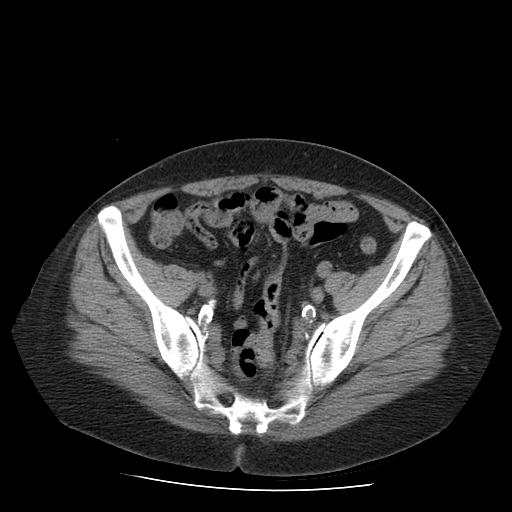
[im 38/75  soft-tissue]
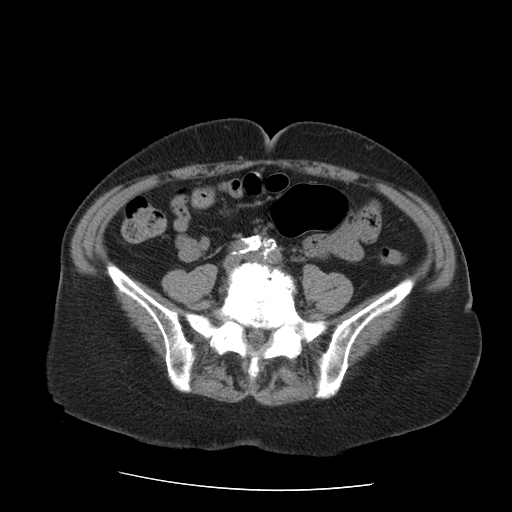
[im 38/75  lung]
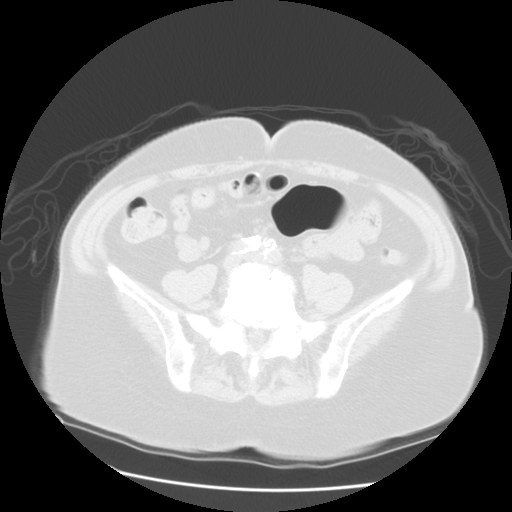
[im 47/75  soft-tissue]
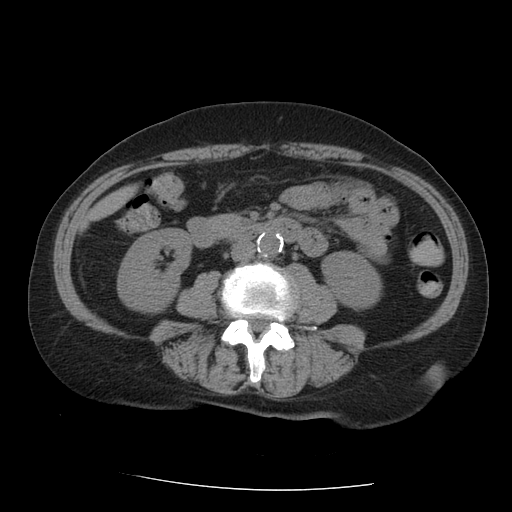
[im 47/75  lung]
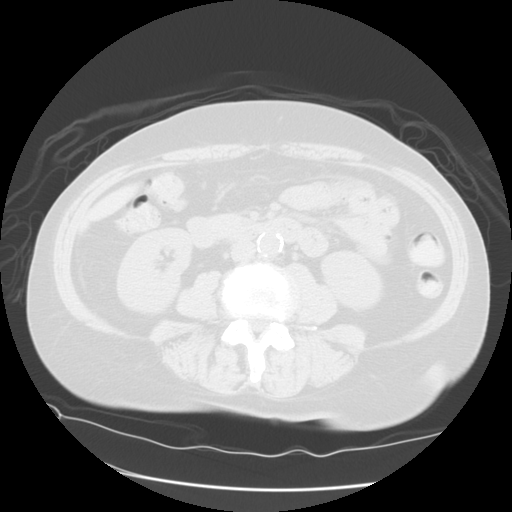
[im 56/75  soft-tissue]
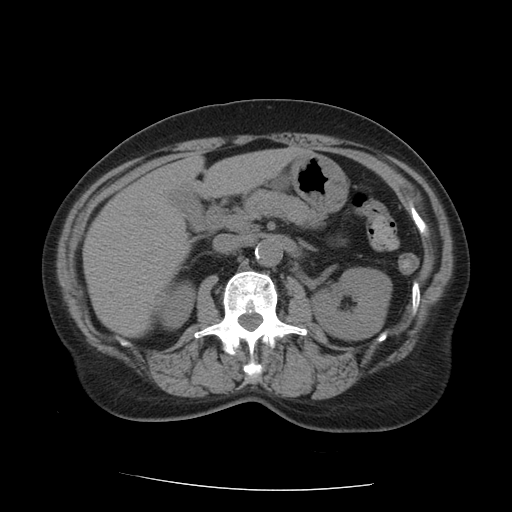
[im 56/75  lung]
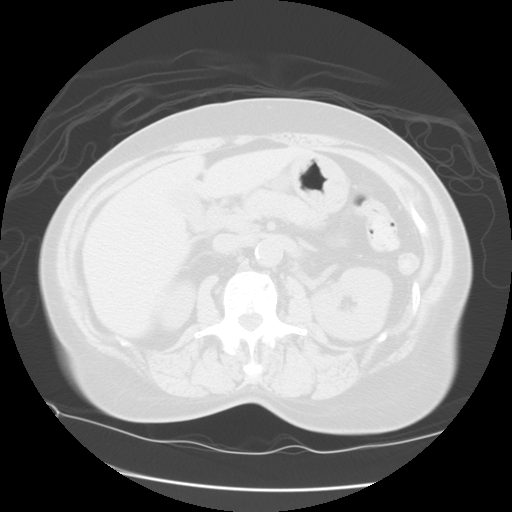
[im 65/75  soft-tissue]
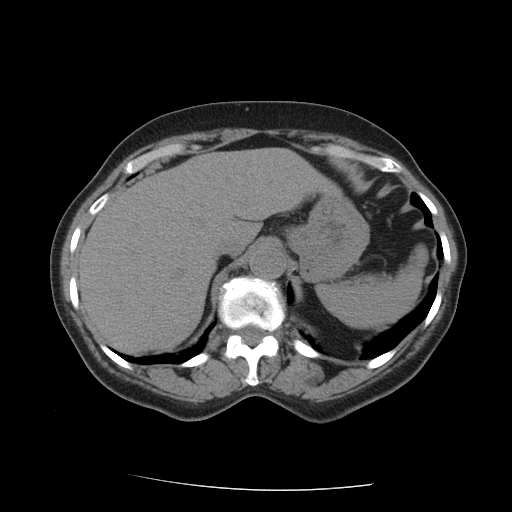
[im 65/75  lung]
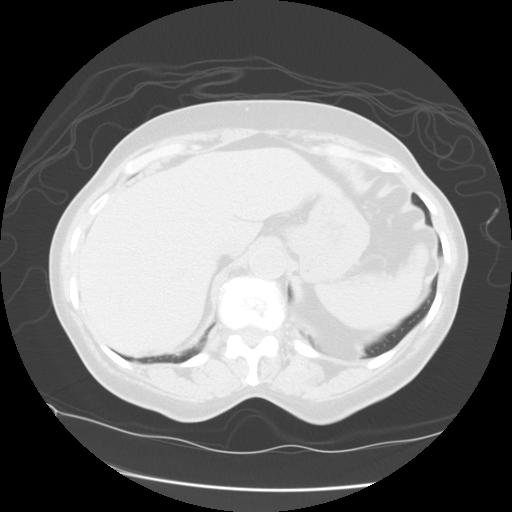

[Series 400: sagittal · sagittal · 0.74mm/px · 7 of 99 slices shown]
[im 10/99  soft-tissue]
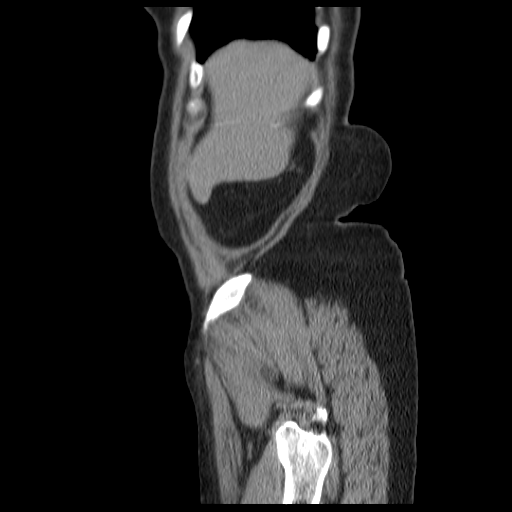
[im 20/99  soft-tissue]
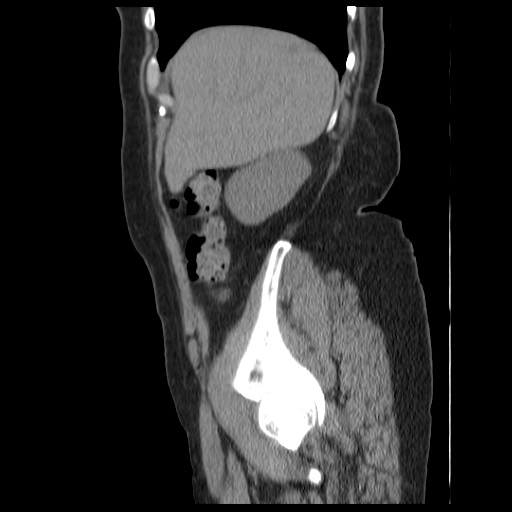
[im 30/99  soft-tissue]
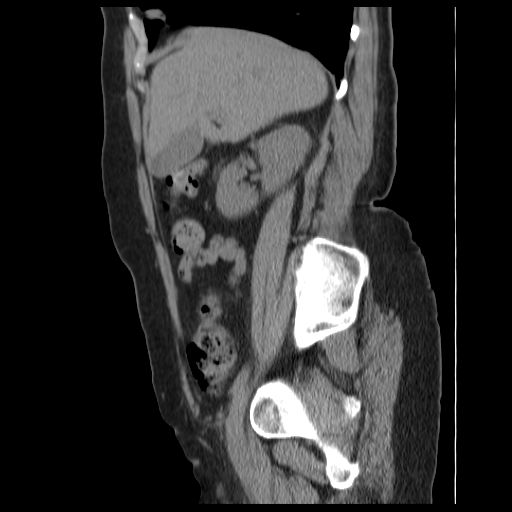
[im 40/99  soft-tissue]
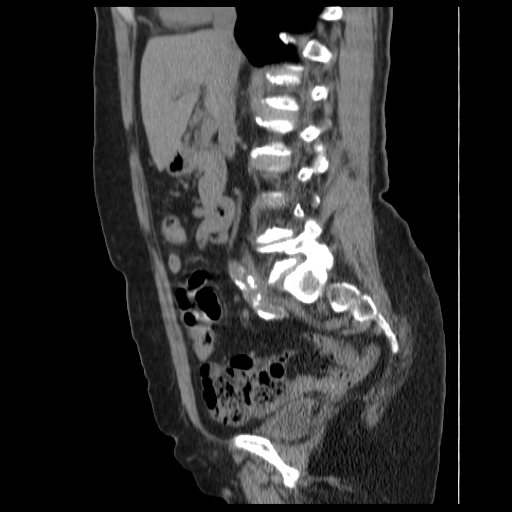
[im 59/99  soft-tissue]
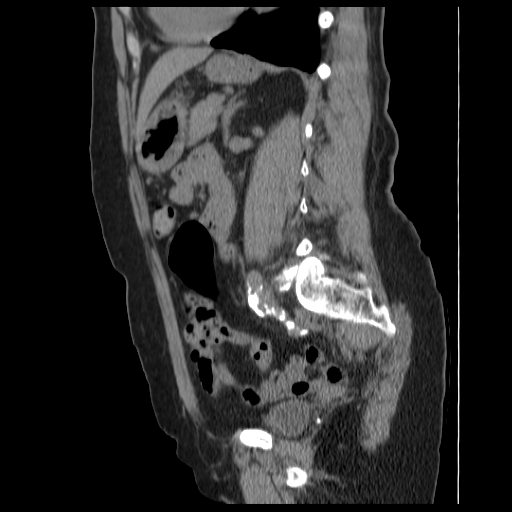
[im 69/99  soft-tissue]
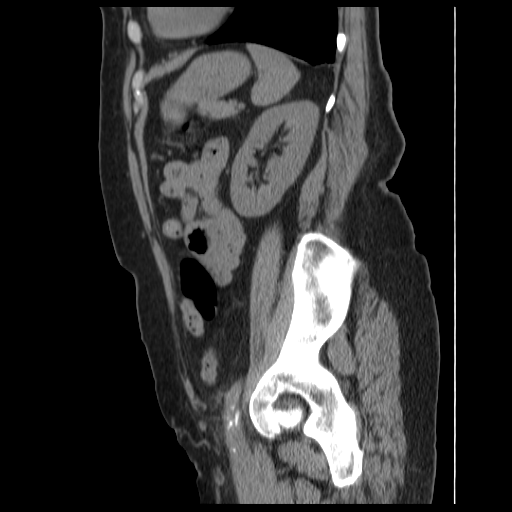
[im 79/99  soft-tissue]
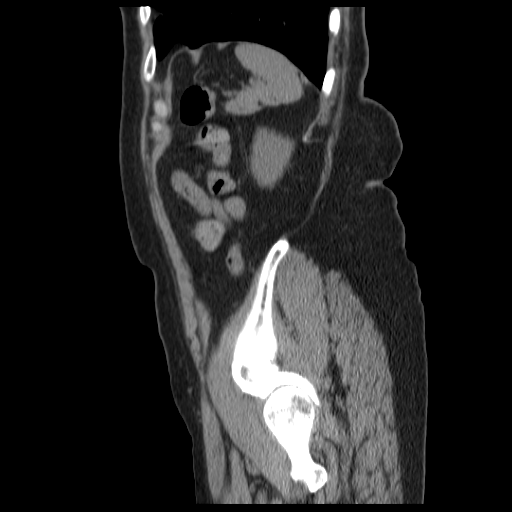

[14 of 32 positions shown; findings below may reference images not displayed]

FINDINGS: Mild linear scarring in the lingula and right middle
lobe.

7 mm probable cyst in the left hepatic lobe (series 2/image 9).  At
least two additional hypoenhancing lesions in the right hepatic
lobe measuring up to 1.4 cm (series 2/images 7 and 18),
statistically likely benign in the absence of known malignancy,
although incompletely characterized.

Spleen, pancreas, and adrenal glands are within normal limits.

Gallbladder is unremarkable.  No intrahepatic or extrahepatic
ductal dilatation.

Kidneys are unremarkable.  No renal calculi or hydronephrosis.

No evidence of bowel obstruction.

Atherosclerotic calcifications of the abdominal aorta and branch
vessels.

No abdominopelvic ascites.

No suspicious abdominopelvic lymphadenopathy.

Status post hysterectomy.  No adnexal masses.

No ureteral or bladder calculi.  Numerous calcified pelvic
phleboliths.

Degenerative changes of the visualized thoracolumbar spine.
IMPRESSION: No renal, ureteral, or bladder calculi.  No hydronephrosis.

No CT findings to account for the patient's abdominal pain.

1.4 cm hypoenhancing lesion in the right hepatic dome, likely
benign, although incompletely characterized.  If further
characterization is required, consider MRI abdomen (with/without
contrast if GFR is above 30) in 6 months.

This recommendation follows ACR consensus guidelines:  Managing
Incidental Findings on Abdominal CT:  White Paper of the ACR
Incidental Findings Committee.  [HOSPITAL] [id number];[DATE].

## 2014-12-20 DIAGNOSIS — H4710 Unspecified papilledema: Secondary | ICD-10-CM | POA: Diagnosis not present

## 2014-12-20 DIAGNOSIS — H532 Diplopia: Secondary | ICD-10-CM | POA: Diagnosis not present

## 2014-12-20 DIAGNOSIS — H43823 Vitreomacular adhesion, bilateral: Secondary | ICD-10-CM | POA: Diagnosis not present

## 2014-12-24 DIAGNOSIS — N051 Unspecified nephritic syndrome with focal and segmental glomerular lesions: Secondary | ICD-10-CM | POA: Diagnosis not present

## 2014-12-24 DIAGNOSIS — G939 Disorder of brain, unspecified: Secondary | ICD-10-CM | POA: Diagnosis not present

## 2014-12-24 DIAGNOSIS — H532 Diplopia: Secondary | ICD-10-CM | POA: Diagnosis not present

## 2014-12-24 DIAGNOSIS — H5340 Unspecified visual field defects: Secondary | ICD-10-CM | POA: Diagnosis not present

## 2014-12-24 DIAGNOSIS — Z23 Encounter for immunization: Secondary | ICD-10-CM | POA: Diagnosis not present

## 2014-12-24 DIAGNOSIS — R809 Proteinuria, unspecified: Secondary | ICD-10-CM | POA: Diagnosis not present

## 2014-12-24 DIAGNOSIS — N181 Chronic kidney disease, stage 1: Secondary | ICD-10-CM | POA: Diagnosis not present

## 2014-12-24 DIAGNOSIS — E785 Hyperlipidemia, unspecified: Secondary | ICD-10-CM | POA: Diagnosis not present

## 2014-12-30 DIAGNOSIS — H47333 Pseudopapilledema of optic disc, bilateral: Secondary | ICD-10-CM | POA: Diagnosis not present

## 2014-12-30 DIAGNOSIS — H40013 Open angle with borderline findings, low risk, bilateral: Secondary | ICD-10-CM | POA: Diagnosis not present

## 2015-02-05 DIAGNOSIS — H532 Diplopia: Secondary | ICD-10-CM | POA: Diagnosis not present

## 2015-02-05 DIAGNOSIS — H5340 Unspecified visual field defects: Secondary | ICD-10-CM | POA: Diagnosis not present

## 2015-02-05 DIAGNOSIS — F17210 Nicotine dependence, cigarettes, uncomplicated: Secondary | ICD-10-CM | POA: Diagnosis not present

## 2015-02-05 DIAGNOSIS — H409 Unspecified glaucoma: Secondary | ICD-10-CM | POA: Diagnosis not present

## 2015-02-05 DIAGNOSIS — H43823 Vitreomacular adhesion, bilateral: Secondary | ICD-10-CM | POA: Diagnosis not present

## 2015-07-01 DIAGNOSIS — H40013 Open angle with borderline findings, low risk, bilateral: Secondary | ICD-10-CM | POA: Diagnosis not present

## 2015-07-01 DIAGNOSIS — H47333 Pseudopapilledema of optic disc, bilateral: Secondary | ICD-10-CM | POA: Diagnosis not present

## 2015-07-01 DIAGNOSIS — H2513 Age-related nuclear cataract, bilateral: Secondary | ICD-10-CM | POA: Diagnosis not present

## 2015-07-01 DIAGNOSIS — H43823 Vitreomacular adhesion, bilateral: Secondary | ICD-10-CM | POA: Diagnosis not present

## 2015-07-31 DIAGNOSIS — H40013 Open angle with borderline findings, low risk, bilateral: Secondary | ICD-10-CM | POA: Diagnosis not present

## 2015-07-31 DIAGNOSIS — H43823 Vitreomacular adhesion, bilateral: Secondary | ICD-10-CM | POA: Diagnosis not present

## 2015-07-31 DIAGNOSIS — H2512 Age-related nuclear cataract, left eye: Secondary | ICD-10-CM | POA: Diagnosis not present

## 2015-07-31 DIAGNOSIS — H3509 Other intraretinal microvascular abnormalities: Secondary | ICD-10-CM | POA: Diagnosis not present

## 2015-07-31 DIAGNOSIS — H2511 Age-related nuclear cataract, right eye: Secondary | ICD-10-CM | POA: Diagnosis not present

## 2015-07-31 DIAGNOSIS — H25011 Cortical age-related cataract, right eye: Secondary | ICD-10-CM | POA: Diagnosis not present

## 2015-07-31 DIAGNOSIS — H35033 Hypertensive retinopathy, bilateral: Secondary | ICD-10-CM | POA: Diagnosis not present

## 2015-08-26 DIAGNOSIS — H2511 Age-related nuclear cataract, right eye: Secondary | ICD-10-CM | POA: Diagnosis not present

## 2015-09-18 DIAGNOSIS — H25012 Cortical age-related cataract, left eye: Secondary | ICD-10-CM | POA: Diagnosis not present

## 2015-09-18 DIAGNOSIS — H2512 Age-related nuclear cataract, left eye: Secondary | ICD-10-CM | POA: Diagnosis not present

## 2016-04-26 DIAGNOSIS — R809 Proteinuria, unspecified: Secondary | ICD-10-CM | POA: Diagnosis not present

## 2016-04-26 DIAGNOSIS — I129 Hypertensive chronic kidney disease with stage 1 through stage 4 chronic kidney disease, or unspecified chronic kidney disease: Secondary | ICD-10-CM | POA: Diagnosis not present

## 2016-04-26 DIAGNOSIS — R69 Illness, unspecified: Secondary | ICD-10-CM | POA: Diagnosis not present

## 2016-04-26 DIAGNOSIS — N179 Acute kidney failure, unspecified: Secondary | ICD-10-CM | POA: Diagnosis not present

## 2016-04-26 DIAGNOSIS — E785 Hyperlipidemia, unspecified: Secondary | ICD-10-CM | POA: Diagnosis not present

## 2016-04-26 DIAGNOSIS — N181 Chronic kidney disease, stage 1: Secondary | ICD-10-CM | POA: Diagnosis not present

## 2016-04-26 DIAGNOSIS — N051 Unspecified nephritic syndrome with focal and segmental glomerular lesions: Secondary | ICD-10-CM | POA: Diagnosis not present

## 2017-03-22 ENCOUNTER — Emergency Department (HOSPITAL_COMMUNITY)
Admission: EM | Admit: 2017-03-22 | Discharge: 2017-03-22 | Disposition: A | Discharge status: Home / Self Care | Payer: Medicare HMO | Attending: Emergency Medicine | Admitting: Emergency Medicine

## 2017-03-22 ENCOUNTER — Encounter (HOSPITAL_COMMUNITY): Payer: Self-pay

## 2017-03-22 DIAGNOSIS — F17200 Nicotine dependence, unspecified, uncomplicated: Secondary | ICD-10-CM | POA: Insufficient documentation

## 2017-03-22 DIAGNOSIS — Z79899 Other long term (current) drug therapy: Secondary | ICD-10-CM | POA: Insufficient documentation

## 2017-03-22 DIAGNOSIS — R69 Illness, unspecified: Secondary | ICD-10-CM | POA: Diagnosis not present

## 2017-03-22 DIAGNOSIS — I10 Essential (primary) hypertension: Secondary | ICD-10-CM | POA: Insufficient documentation

## 2017-03-22 DIAGNOSIS — N611 Abscess of the breast and nipple: Secondary | ICD-10-CM | POA: Diagnosis not present

## 2017-03-22 DIAGNOSIS — N61 Mastitis without abscess: Secondary | ICD-10-CM | POA: Diagnosis not present

## 2017-03-22 HISTORY — DX: Disorder of kidney and ureter, unspecified: N28.9

## 2017-03-22 MED ORDER — NAPROXEN 250 MG PO TABS: 250.0000 mg | ORAL_TABLET | Freq: Two times a day (BID) | ORAL | 0 refills | Status: AC | PRN

## 2017-03-22 MED ORDER — CLINDAMYCIN HCL 300 MG PO CAPS: 300.0000 mg | ORAL_CAPSULE | Freq: Three times a day (TID) | ORAL | 0 refills | Status: AC

## 2017-03-22 MED ORDER — HYDROCODONE-ACETAMINOPHEN 5-325 MG PO TABS
1.0000 | ORAL_TABLET | Freq: Four times a day (QID) | ORAL | 0 refills | Status: DC | PRN
Start: 2017-03-22 — End: 2019-09-28

## 2017-03-22 NOTE — ED Provider Notes (Signed)
[facility]-EMERGENCY DEPT Provider Note   CSN: [id number] Arrival date & time: 03/22/17  0902     History   Chief Complaint Chief Complaint  Patient presents with  . Abscess    HPI [name] is a 76 y.o. female with a PMHx of HLD, HTN, anemia, and nephrotic syndrome, who presents to the ED with complaints of right breast abscess 3 days. She states she first noticed a boil 3 days ago, and it has become more painful and erythematous and has begun draining a thick yellow purulent material when she squeezes it. States that it's harder to express this drainage then with her prior abscesses. She has had multiple breast abscesses in the past, never needed surgery but they have been lanced before. She reports that the pain is 7/10 intermittent sharp nonradiating pain in the right breast where the abscess is located, worse with palpation to the area, and mildly improved with "boil ease" and Goody's powders. She denies any warmth, red streaking, nipple discharge/inversion, fevers, chills, CP, SOB, abd pain, N/V/D/C, hematuria, dysuria, myalgias, arthralgias, numbness, tingling, focal weakness, or any other complaints at this time. She had face swelling and SOB with PCNs. Sees Dr. Darrick Penna for her kidney follow up, but isn't sure what her most recent kidney function is. No PCP at this time. Takes no medications regularly.    The history is provided by the patient and medical records. No language interpreter was used.  Abscess  Location:  Torso Torso abscess location:  R breast Abscess quality: draining, induration, painful and redness   Abscess quality: no warmth   Red streaking: no   Duration:  3 days Progression:  Unchanged Pain details:    Quality:  Sharp   Severity:  Moderate   Duration:  3 days   Timing:  Intermittent   Progression:  Unchanged Chronicity:  Recurrent Relieved by:  NSAIDs Worsened by:  Draining/squeezing Ineffective treatments:  None tried Associated symptoms: no  fever, no nausea and no vomiting   Risk factors: prior abscess     Past Medical History:  Diagnosis Date  . Renal disorder     Patient Active Problem List   Diagnosis Date Noted  . HYPERLIPIDEMIA 06/11/2009  . DISORDERS OF PHOSPHORUS METABOLISM 06/11/2009  . ANEMIA 06/11/2009  . ESSENTIAL HYPERTENSION 06/11/2009  . NEPHROTIC SYNDROME 06/11/2009    History reviewed. No pertinent surgical history.  OB History    No data available       Home Medications    Prior to Admission medications   Medication Sig Start Date End Date Taking? Authorizing Provider  sulfamethoxazole-trimethoprim (SEPTRA DS) 800-160 MG per tablet Take 1 tablet by mouth every 12 (twelve) hours. 01/08/13   Nicole Pisciotta, PA-C  traMADol (ULTRAM) 50 MG tablet Take 1 tablet (50 mg total) by mouth every 6 (six) hours as needed for pain. 01/08/13   Joni Reining Pisciotta, PA-C    Family History No family history on file.  Social History Social History  Substance Use Topics  . Smoking status: Current Every Day Smoker  . Smokeless tobacco: Not on file  . Alcohol use No     Allergies   Penicillins   Review of Systems Review of Systems  Constitutional: Negative for chills and fever.  Respiratory: Negative for shortness of breath.   Cardiovascular: Negative for chest pain.  Gastrointestinal: Negative for abdominal pain, constipation, diarrhea, nausea and vomiting.  Genitourinary: Negative for dysuria and hematuria.  Musculoskeletal: Negative for arthralgias and myalgias.  Skin: Positive for color  change and wound.  Allergic/Immunologic: Negative for immunocompromised state.  Neurological: Negative for weakness and numbness.  Psychiatric/Behavioral: Negative for confusion.   10 Systems reviewed and are negative for acute change except as noted in the HPI.   Physical Exam Updated Vital Signs BP (!) 151/80   Pulse 89   Temp 98.3 F (36.8 C) (Oral)   Resp 18   SpO2 99%   Physical Exam    Constitutional: She is oriented to person, place, and time. Vital signs are normal. She appears well-developed and well-nourished.  Non-toxic appearance. No distress.  Afebrile, nontoxic, NAD  HENT:  Head: Normocephalic and atraumatic.  Mouth/Throat: Oropharynx is clear and moist and mucous membranes are normal.  Eyes: Conjunctivae and EOM are normal. Right eye exhibits no discharge. Left eye exhibits no discharge.  Neck: Normal range of motion. Neck supple.  Cardiovascular: Normal rate, regular rhythm, normal heart sounds and intact distal pulses.  Exam reveals no gallop and no friction rub.   No murmur heard. Pulmonary/Chest: Effort normal and breath sounds normal. No respiratory distress. She has no decreased breath sounds. She has no wheezes. She has no rhonchi. She has no rales. Right breast exhibits mass, skin change and tenderness. Right breast exhibits no inverted nipple and no nipple discharge.    ~3cm indurated abscess to the upper lateral quadrant of the areola of the R breast, with a large ~1cm linear skin opening in the center as well as a smaller skin opening next to the larger one, thick green purulent material noted but not easily drained due to thickness of purulent material; moderately TTP. Mildly erythematous and warm to the touch over the abscess, but no red streaking or spreading warmth/erythema. No fluctuance noted. No nipple inversion or drainage. No peau d'orange. No other masses noted to remainder of breast  Abdominal: Soft. Normal appearance and bowel sounds are normal. She exhibits no distension. There is no tenderness. There is no rigidity, no rebound, no guarding, no CVA tenderness, no tenderness at McBurney's point and negative Murphy's sign.  Musculoskeletal: Normal range of motion.  Neurological: She is alert and oriented to person, place, and time. She has normal strength. No sensory deficit.  Skin: Skin is warm, dry and intact. No rash noted. There is erythema.  R  breast abscess as mentioned above  Psychiatric: She has a normal mood and affect. Her behavior is normal.  Nursing note and vitals reviewed.    ED Treatments / Results  Labs (all labs ordered are listed, but only abnormal results are displayed) Labs Reviewed - No data to display  EKG  EKG Interpretation None       Radiology No results found.  Procedures Procedures (including critical care time)  Medications Ordered in ED Medications - No data to display   Initial Impression / Assessment and Plan / ED Course  I have reviewed the triage vital signs and the nursing notes.  Pertinent labs & imaging results that were available during my care of the patient were reviewed by me and considered in my medical decision making (see chart for details).     76 y.o. female here with R breast abscess x3 days. Hx of same, has needed them lanced but never had surgery. On exam, indurated area to the lateral areolar region, large opening and smaller opening in skin with purulent material noted but not easily drained due to it being thick. Mildly erythematous and slightly warm, no spreading redness/warmth. No nipple drainage. Advised that although this looks  like a breast abscess/mastitis, it could still be something more sinister like a cancerous lump. Advised that we will start abx, no need for I&D since it's already got large openings to drain from and there's not fluctuance to drain currently; but will need to f/up possibly for U/S or mammogram to further evaluate that possibility. Pt doesn't have a PCP, will have her f/up with [facility] in 2-3 days for recheck and for ongoing management and work up. Pt with hx of nephrotic syndrome, no recent labs in our system, so will opt to avoid bactrim; pt allergic to PCN with anaphylactic type reaction so will avoid keflex; therefore will choose clindamycin to treat her abscess. Warm compresses advised. Pain med rx given. [place] reviewed prior to  dispensing controlled substance medications, and was notable for: none found. Risks/benefits/alternatives and expectations discussed regarding controlled substances. Side effects of medications discussed. Informed consent obtained. F/up with [facility] in 2-3 days for recheck. I explained the diagnosis and have given explicit precautions to return to the ER including for any other new or worsening symptoms. The patient understands and accepts the medical plan as it's been dictated and I have answered their questions. Discharge instructions concerning home care and prescriptions have been given. The patient is STABLE and is discharged to home in good condition.    Final Clinical Impressions(s) / ED Diagnoses   Final diagnoses:  Breast abscess  Mastitis in female    New Prescriptions New Prescriptions   CLINDAMYCIN (CLEOCIN) 300 MG CAPSULE    Take 1 capsule (300 mg total) by mouth 3 (three) times daily. X 7 days   HYDROCODONE-ACETAMINOPHEN (NORCO) 5-325 MG TABLET    Take 1 tablet by mouth every 6 (six) hours as needed for severe pain.   NAPROXEN (NAPROSYN) 250 MG TABLET    Take 1 tablet (250 mg total) by mouth 2 (two) times daily as needed for mild pain, moderate pain or headache (TAKE WITH MEALS.).     [address] PA-C 03/22/17 1150    Raeford Razor, MD 03/30/17 1225

## 2017-03-22 NOTE — Discharge Instructions (Signed)
Keep wound clean and dry. Apply warm compresses to affected area throughout the day. Take antibiotic until it is finished. Take naprosyn and norco as directed, as needed for pain but do not drive or operate machinery with pain medication use. Followup with [facility] in 2-3 days for wound recheck and to establish care, as well as for follow up so you can be evaluated to see if you need a mammogram. Monitor area for signs of infection to include, but not limited to: increasing pain, spreading redness, drainage/pus, worsening swelling, or fevers. Return to emergency department for emergent changing or worsening symptoms.

## 2017-03-22 NOTE — ED Triage Notes (Signed)
Patient here with right breast tenderness and drainage around nipple x 3 days. States very painful for past 2 days

## 2017-05-11 DIAGNOSIS — N051 Unspecified nephritic syndrome with focal and segmental glomerular lesions: Secondary | ICD-10-CM | POA: Diagnosis not present

## 2017-05-11 DIAGNOSIS — R809 Proteinuria, unspecified: Secondary | ICD-10-CM | POA: Diagnosis not present

## 2017-05-11 DIAGNOSIS — E785 Hyperlipidemia, unspecified: Secondary | ICD-10-CM | POA: Diagnosis not present

## 2017-05-11 DIAGNOSIS — N179 Acute kidney failure, unspecified: Secondary | ICD-10-CM | POA: Diagnosis not present

## 2017-05-11 DIAGNOSIS — R69 Illness, unspecified: Secondary | ICD-10-CM | POA: Diagnosis not present

## 2017-05-11 DIAGNOSIS — N181 Chronic kidney disease, stage 1: Secondary | ICD-10-CM | POA: Diagnosis not present

## 2017-05-11 DIAGNOSIS — I129 Hypertensive chronic kidney disease with stage 1 through stage 4 chronic kidney disease, or unspecified chronic kidney disease: Secondary | ICD-10-CM | POA: Diagnosis not present

## 2018-01-11 DIAGNOSIS — R5383 Other fatigue: Secondary | ICD-10-CM | POA: Diagnosis not present

## 2018-01-11 DIAGNOSIS — I700 Atherosclerosis of aorta: Secondary | ICD-10-CM | POA: Diagnosis not present

## 2018-01-11 DIAGNOSIS — R634 Abnormal weight loss: Secondary | ICD-10-CM | POA: Diagnosis not present

## 2018-01-12 DIAGNOSIS — R5383 Other fatigue: Secondary | ICD-10-CM | POA: Diagnosis not present

## 2018-01-12 DIAGNOSIS — N390 Urinary tract infection, site not specified: Secondary | ICD-10-CM | POA: Diagnosis not present

## 2018-01-12 DIAGNOSIS — R634 Abnormal weight loss: Secondary | ICD-10-CM | POA: Diagnosis not present

## 2018-01-18 DIAGNOSIS — R634 Abnormal weight loss: Secondary | ICD-10-CM | POA: Diagnosis not present

## 2018-01-18 DIAGNOSIS — R5383 Other fatigue: Secondary | ICD-10-CM | POA: Diagnosis not present

## 2018-03-22 DIAGNOSIS — R5383 Other fatigue: Secondary | ICD-10-CM | POA: Diagnosis not present

## 2018-03-22 DIAGNOSIS — R634 Abnormal weight loss: Secondary | ICD-10-CM | POA: Diagnosis not present

## 2018-09-13 DIAGNOSIS — Z13220 Encounter for screening for lipoid disorders: Secondary | ICD-10-CM | POA: Diagnosis not present

## 2018-09-13 DIAGNOSIS — R634 Abnormal weight loss: Secondary | ICD-10-CM | POA: Diagnosis not present

## 2018-09-13 DIAGNOSIS — Z23 Encounter for immunization: Secondary | ICD-10-CM | POA: Diagnosis not present

## 2018-09-13 DIAGNOSIS — Z0000 Encounter for general adult medical examination without abnormal findings: Secondary | ICD-10-CM | POA: Diagnosis not present

## 2018-09-13 DIAGNOSIS — R5383 Other fatigue: Secondary | ICD-10-CM | POA: Diagnosis not present

## 2018-09-25 DIAGNOSIS — Z0000 Encounter for general adult medical examination without abnormal findings: Secondary | ICD-10-CM | POA: Diagnosis not present

## 2018-09-25 DIAGNOSIS — R319 Hematuria, unspecified: Secondary | ICD-10-CM | POA: Diagnosis not present

## 2018-09-25 DIAGNOSIS — R5383 Other fatigue: Secondary | ICD-10-CM | POA: Diagnosis not present

## 2019-05-21 DIAGNOSIS — R319 Hematuria, unspecified: Secondary | ICD-10-CM | POA: Diagnosis not present

## 2019-05-21 DIAGNOSIS — R5383 Other fatigue: Secondary | ICD-10-CM | POA: Diagnosis not present

## 2019-05-28 DIAGNOSIS — M25512 Pain in left shoulder: Secondary | ICD-10-CM | POA: Diagnosis not present

## 2019-05-28 DIAGNOSIS — R634 Abnormal weight loss: Secondary | ICD-10-CM | POA: Diagnosis not present

## 2019-05-28 DIAGNOSIS — Z7189 Other specified counseling: Secondary | ICD-10-CM | POA: Diagnosis not present

## 2019-05-28 DIAGNOSIS — M545 Low back pain: Secondary | ICD-10-CM | POA: Diagnosis not present

## 2019-05-28 DIAGNOSIS — R319 Hematuria, unspecified: Secondary | ICD-10-CM | POA: Diagnosis not present

## 2019-05-28 DIAGNOSIS — M5441 Lumbago with sciatica, right side: Secondary | ICD-10-CM | POA: Diagnosis not present

## 2019-08-13 DIAGNOSIS — R319 Hematuria, unspecified: Secondary | ICD-10-CM | POA: Diagnosis not present

## 2019-08-13 DIAGNOSIS — M25512 Pain in left shoulder: Secondary | ICD-10-CM | POA: Diagnosis not present

## 2019-08-13 DIAGNOSIS — R5383 Other fatigue: Secondary | ICD-10-CM | POA: Diagnosis not present

## 2019-08-13 DIAGNOSIS — Z7189 Other specified counseling: Secondary | ICD-10-CM | POA: Diagnosis not present

## 2019-08-15 DIAGNOSIS — M25512 Pain in left shoulder: Secondary | ICD-10-CM | POA: Diagnosis not present

## 2019-08-15 DIAGNOSIS — M7550 Bursitis of unspecified shoulder: Secondary | ICD-10-CM | POA: Diagnosis not present

## 2019-08-15 DIAGNOSIS — M7582 Other shoulder lesions, left shoulder: Secondary | ICD-10-CM | POA: Diagnosis not present

## 2019-09-26 DIAGNOSIS — Z7189 Other specified counseling: Secondary | ICD-10-CM | POA: Diagnosis not present

## 2019-09-26 DIAGNOSIS — M5431 Sciatica, right side: Secondary | ICD-10-CM | POA: Diagnosis not present

## 2019-09-28 ENCOUNTER — Encounter (HOSPITAL_COMMUNITY): Payer: Self-pay | Admitting: Emergency Medicine

## 2019-09-28 ENCOUNTER — Other Ambulatory Visit: Payer: Self-pay

## 2019-09-28 ENCOUNTER — Emergency Department (HOSPITAL_COMMUNITY)
Admission: EM | Admit: 2019-09-28 | Discharge: 2019-09-28 | Disposition: A | Discharge status: Home / Self Care | Payer: Medicare HMO | Attending: Emergency Medicine | Admitting: Emergency Medicine

## 2019-09-28 DIAGNOSIS — M5431 Sciatica, right side: Secondary | ICD-10-CM | POA: Diagnosis not present

## 2019-09-28 DIAGNOSIS — M79604 Pain in right leg: Secondary | ICD-10-CM | POA: Diagnosis not present

## 2019-09-28 DIAGNOSIS — R69 Illness, unspecified: Secondary | ICD-10-CM | POA: Diagnosis not present

## 2019-09-28 DIAGNOSIS — Z79899 Other long term (current) drug therapy: Secondary | ICD-10-CM | POA: Diagnosis not present

## 2019-09-28 DIAGNOSIS — M5441 Lumbago with sciatica, right side: Secondary | ICD-10-CM | POA: Diagnosis not present

## 2019-09-28 DIAGNOSIS — M25551 Pain in right hip: Secondary | ICD-10-CM | POA: Diagnosis present

## 2019-09-28 DIAGNOSIS — F17210 Nicotine dependence, cigarettes, uncomplicated: Secondary | ICD-10-CM | POA: Insufficient documentation

## 2019-09-28 MED ORDER — HYDROCODONE-ACETAMINOPHEN 5-325 MG PO TABS: 1.0000 | ORAL_TABLET | ORAL | 0 refills | Status: AC | PRN

## 2019-09-28 MED ORDER — PREDNISONE 20 MG PO TABS: 40.0000 mg | ORAL_TABLET | Freq: Every day | ORAL | 0 refills | Status: AC

## 2019-09-28 NOTE — Discharge Instructions (Addendum)
I have provided a short prescription for steroids to help with your symptoms, please take 2 tablets daily for the next 5 days.  Please be aware steroids can cause insomnia, changes in appetite, flushness.  4 tablets of Norco have been provided for you, please take these only for severe pain.  May continue taking your muscle relaxer which was prescribed by your primary care physician.  We discussed heat or ice therapy to help with your symptoms.  If you experience any urinary retention, bowel incontinence, numbness to your legs you may return to the emergency department.

## 2019-09-28 NOTE — ED Provider Notes (Signed)
[facility] EMERGENCY DEPARTMENT Provider Note   CSN: [id number] Arrival date & time: 09/28/19  5035     History   Chief Complaint Chief Complaint  Patient presents with  . Sciatica    HPI [name] is a 78 y.o. female.     78 y.o female with a PMH of renal disorder who presents to the ED with a chief complaint of right hip pain. Patient reports she was diagnosed with sciatica by her PCP 2 days ago at the office, was placed on Tanzeum 1810 mg 3 times daily.  Patient describes a sharp sensation originating at her right buttocks with radiation to her right back of the knee.  Reports this pain is worse with palpation, while sitting down along with sleeping.  She has tried some heat, reports this makes her feel worse.  She has taken 1 tizanidine pill without improvement in symptoms.  Patient reports she felt the pain was so severe this morning that she vomited her muscle relaxer.  She reports no prior history of cancer, fevers, urinary retention or bowel incontinence.  No prior history of trauma.  The history is provided by the patient.    Past Medical History:  Diagnosis Date  . Renal disorder     Patient Active Problem List   Diagnosis Date Noted  . HYPERLIPIDEMIA 06/11/2009  . DISORDERS OF PHOSPHORUS METABOLISM 06/11/2009  . ANEMIA 06/11/2009  . ESSENTIAL HYPERTENSION 06/11/2009  . NEPHROTIC SYNDROME 06/11/2009    History reviewed. No pertinent surgical history.   OB History   No obstetric history on file.      Home Medications    Prior to Admission medications   Medication Sig Start Date End Date Taking? Authorizing Provider  HYDROcodone-acetaminophen (NORCO/VICODIN) 5-325 MG tablet Take 1 tablet by mouth every 4 (four) hours as needed for up to 3 days. 09/28/19 10/01/19  Claude Manges, PA-C  naproxen (NAPROSYN) 250 MG tablet Take 1 tablet (250 mg total) by mouth 2 (two) times daily as needed for mild pain, moderate pain or headache (TAKE  WITH MEALS.). 03/22/17   Street, Mercedes, PA-C  predniSONE (DELTASONE) 20 MG tablet Take 2 tablets (40 mg total) by mouth daily for 5 days. 09/28/19 10/03/19  Claude Manges, PA-C  sulfamethoxazole-trimethoprim (SEPTRA DS) 800-160 MG per tablet Take 1 tablet by mouth every 12 (twelve) hours. 01/08/13   Pisciotta, Joni Reining, PA-C  traMADol (ULTRAM) 50 MG tablet Take 1 tablet (50 mg total) by mouth every 6 (six) hours as needed for pain. 01/08/13   Pisciotta, Mardella Layman    Family History No family history on file.  Social History Social History   Tobacco Use  . Smoking status: Current Every Day Smoker  Substance Use Topics  . Alcohol use: No  . Drug use: Not on file     Allergies   Penicillins   Review of Systems Review of Systems  Constitutional: Negative for fever.  Genitourinary: Negative for difficulty urinating.  Musculoskeletal: Positive for back pain and myalgias.     Physical Exam Updated Vital Signs BP (!) 156/80 (BP Location: Left Arm)   Pulse 84   Temp 98.7 F (37.1 C) (Oral)   Resp 16   SpO2 98%   Physical Exam Vitals signs and nursing note reviewed.  Constitutional:      Appearance: Normal appearance.  HENT:     Head: Normocephalic and atraumatic.     Mouth/Throat:     Pharynx: Oropharynx is clear.  Eyes:  Pupils: Pupils are equal, round, and reactive to light.  Neck:     Musculoskeletal: Normal range of motion and neck supple.  Cardiovascular:     Rate and Rhythm: Normal rate.  Pulmonary:     Effort: Pulmonary effort is normal.  Abdominal:     General: Abdomen is flat.  Musculoskeletal:       Back:  Skin:    General: Skin is warm and dry.  Neurological:     Mental Status: She is alert and oriented to person, place, and time.     Comments: Alert, oriented, thought content appropriate. Speech fluent without evidence of aphasia. Able to follow 2 step commands without difficulty.  Cranial Nerves:  II:  Peripheral visual fields grossly normal,  pupils, round, reactive to light III,IV, VI: ptosis not present, extra-ocular motions intact bilaterally  V,VII: smile symmetric, facial light touch sensation equal VIII: hearing grossly normal bilaterally  IX,X: midline uvula rise  XI: bilateral shoulder shrug equal and strong XII: midline tongue extension  Motor:  5/5 in upper and lower extremities bilaterally including strong and equal grip strength and dorsiflexion/plantar flexion Sensory: light touch normal in all extremities.  Cerebellar: normal finger-to-nose with bilateral upper extremities, pronator drift negative Gait: Antalgic gait       ED Treatments / Results  Labs (all labs ordered are listed, but only abnormal results are displayed) Labs Reviewed - No data to display  EKG None  Radiology No results found.  Procedures Procedures (including critical care time)  Medications Ordered in ED Medications - No data to display   Initial Impression / Assessment and Plan / ED Course  I have reviewed the triage vital signs and the nursing notes.  Pertinent labs & imaging results that were available during my care of the patient were reviewed by me and considered in my medical decision making (see chart for details).       Patient with a past medical history of renal disorder several years ago presents to the ED with a chief complaint of sciatica on the right eye, diagnosed by her PCP 2 days ago.  She was placed on tizanidine however reports due to pain she has been unable to keep this medication down.  She does not have any previous history of cancer, fevers, urinary retention or bowel incontinence.  Patient arrived in the ED with stable vital signs, antalgic gait was noted, exam is unremarkable aside from significant pain to the right buttocks.  There is no midline tenderness on my exam.  She is currently on muscle relaxers, does not have a prior history of diabetes will prescribe a short course of steroids to help  with muscle spasms and swelling.  Patient is requesting pain medication as she reports she is unable to go about her daily activities, will prescribe her 4 tablets of Norco to help with her symptoms.  Discussed risks and benefits of treating this condition with an opioid, patient is agreeable of taking this medication and has tolerated this medication in the past according to her medical records.  Patient understands and agrees with management, return precautions provided at length.   Portions of this note were generated with Lobby[employer]. Dictation errors may occur despite best attempts at proofreading.  Final Clinical Impressions(s) / ED Diagnoses   Final diagnoses:  Sciatica of right side    ED Discharge Orders         Ordered    predniSONE (DELTASONE) 20 MG tablet  Daily  09/28/19 1054    HYDROcodone-acetaminophen (NORCO/VICODIN) 5-325 MG tablet  Every 4 hours PRN     09/28/19 1055           Claude MangesSoto, Dianelly Ferran, PA-C 09/28/19 1058    Margarita Grizzleay, Danielle, MD 10/01/19 1227

## 2019-09-28 NOTE — ED Triage Notes (Signed)
Pt states she saw her pcp yesterday, diagnosed with sciatica. States that R leg pain is worse today. Was prescribed muscle relaxer but states when she tried to take it this morning she vomited.

## 2019-10-08 DIAGNOSIS — M5136 Other intervertebral disc degeneration, lumbar region: Secondary | ICD-10-CM | POA: Diagnosis not present

## 2019-10-08 DIAGNOSIS — M5431 Sciatica, right side: Secondary | ICD-10-CM | POA: Diagnosis not present

## 2019-10-08 DIAGNOSIS — R5383 Other fatigue: Secondary | ICD-10-CM | POA: Diagnosis not present

## 2019-10-08 DIAGNOSIS — Z780 Asymptomatic menopausal state: Secondary | ICD-10-CM | POA: Diagnosis not present

## 2019-10-08 DIAGNOSIS — Z0000 Encounter for general adult medical examination without abnormal findings: Secondary | ICD-10-CM | POA: Diagnosis not present

## 2019-10-08 DIAGNOSIS — M47816 Spondylosis without myelopathy or radiculopathy, lumbar region: Secondary | ICD-10-CM | POA: Diagnosis not present

## 2019-10-08 DIAGNOSIS — M25512 Pain in left shoulder: Secondary | ICD-10-CM | POA: Diagnosis not present

## 2019-10-08 DIAGNOSIS — Z7189 Other specified counseling: Secondary | ICD-10-CM | POA: Diagnosis not present

## 2019-10-08 DIAGNOSIS — R319 Hematuria, unspecified: Secondary | ICD-10-CM | POA: Diagnosis not present

## 2019-10-15 DIAGNOSIS — R634 Abnormal weight loss: Secondary | ICD-10-CM | POA: Diagnosis not present

## 2019-10-15 DIAGNOSIS — R5383 Other fatigue: Secondary | ICD-10-CM | POA: Diagnosis not present

## 2019-10-15 DIAGNOSIS — Z23 Encounter for immunization: Secondary | ICD-10-CM | POA: Diagnosis not present

## 2019-10-15 DIAGNOSIS — R319 Hematuria, unspecified: Secondary | ICD-10-CM | POA: Diagnosis not present

## 2019-10-15 DIAGNOSIS — Z7189 Other specified counseling: Secondary | ICD-10-CM | POA: Diagnosis not present

## 2019-10-15 DIAGNOSIS — Z0000 Encounter for general adult medical examination without abnormal findings: Secondary | ICD-10-CM | POA: Diagnosis not present

## 2020-10-11 ENCOUNTER — Ambulatory Visit: Payer: Medicare HMO

## 2020-10-25 ENCOUNTER — Ambulatory Visit: Payer: Medicare HMO | Attending: Internal Medicine

## 2020-10-25 DIAGNOSIS — Z23 Encounter for immunization: Secondary | ICD-10-CM

## 2020-10-25 NOTE — Progress Notes (Signed)
   Covid-19 Vaccination Clinic  Name:  Blakelee Allington    MRN: [id number] DOB: 11-06-1941  10/25/2020  Ms. Ruhland was observed post Covid-19 immunization for 15 minutes without incident. She was provided with Vaccine Information Sheet and instruction to access the V-Safe system.   Ms. Lennox was instructed to call 911 with any severe reactions post vaccine: Marland Kitchen Difficulty breathing  . Swelling of face and throat  . A fast heartbeat  . A bad rash all over body  . Dizziness and weakness   Immunizations Administered    Name Date Dose VIS Date Route   Pfizer COVID-19 Vaccine 10/25/2020 11:18 AM 0.3 mL 10/01/2020 Intramuscular   Manufacturer: [employer], [employer]   Lot: [id number]   NDC: [address]-3

## 2020-12-22 DIAGNOSIS — M5431 Sciatica, right side: Secondary | ICD-10-CM | POA: Diagnosis not present

## 2020-12-22 DIAGNOSIS — N390 Urinary tract infection, site not specified: Secondary | ICD-10-CM | POA: Diagnosis not present

## 2020-12-22 DIAGNOSIS — Z23 Encounter for immunization: Secondary | ICD-10-CM | POA: Diagnosis not present

## 2020-12-22 DIAGNOSIS — R5383 Other fatigue: Secondary | ICD-10-CM | POA: Diagnosis not present

## 2020-12-22 DIAGNOSIS — R252 Cramp and spasm: Secondary | ICD-10-CM | POA: Diagnosis not present

## 2021-02-11 DIAGNOSIS — M5431 Sciatica, right side: Secondary | ICD-10-CM | POA: Diagnosis not present

## 2021-02-11 DIAGNOSIS — G4762 Sleep related leg cramps: Secondary | ICD-10-CM | POA: Diagnosis not present

## 2021-02-11 DIAGNOSIS — R319 Hematuria, unspecified: Secondary | ICD-10-CM | POA: Diagnosis not present

## 2021-02-11 DIAGNOSIS — Z0000 Encounter for general adult medical examination without abnormal findings: Secondary | ICD-10-CM | POA: Diagnosis not present

## 2021-02-11 DIAGNOSIS — R5383 Other fatigue: Secondary | ICD-10-CM | POA: Diagnosis not present

## 2021-02-11 DIAGNOSIS — I700 Atherosclerosis of aorta: Secondary | ICD-10-CM | POA: Diagnosis not present

## 2021-04-13 ENCOUNTER — Ambulatory Visit: Payer: Medicare HMO

## 2021-04-13 ENCOUNTER — Other Ambulatory Visit (HOSPITAL_BASED_OUTPATIENT_CLINIC_OR_DEPARTMENT_OTHER): Payer: Self-pay

## 2021-06-21 DIAGNOSIS — Z20822 Contact with and (suspected) exposure to covid-19: Secondary | ICD-10-CM | POA: Diagnosis not present

## 2021-06-26 DIAGNOSIS — U071 COVID-19: Secondary | ICD-10-CM | POA: Diagnosis not present

## 2021-06-26 DIAGNOSIS — R059 Cough, unspecified: Secondary | ICD-10-CM | POA: Diagnosis not present

## 2021-06-27 DIAGNOSIS — R0982 Postnasal drip: Secondary | ICD-10-CM | POA: Diagnosis not present

## 2021-06-27 DIAGNOSIS — U071 COVID-19: Secondary | ICD-10-CM | POA: Diagnosis not present

## 2021-06-27 DIAGNOSIS — R059 Cough, unspecified: Secondary | ICD-10-CM | POA: Diagnosis not present

## 2021-07-23 DIAGNOSIS — M25571 Pain in right ankle and joints of right foot: Secondary | ICD-10-CM | POA: Diagnosis not present

## 2021-07-23 DIAGNOSIS — M359 Systemic involvement of connective tissue, unspecified: Secondary | ICD-10-CM | POA: Diagnosis not present

## 2021-07-23 DIAGNOSIS — R5383 Other fatigue: Secondary | ICD-10-CM | POA: Diagnosis not present

## 2021-07-23 DIAGNOSIS — M25562 Pain in left knee: Secondary | ICD-10-CM | POA: Diagnosis not present

## 2021-07-23 DIAGNOSIS — M62838 Other muscle spasm: Secondary | ICD-10-CM | POA: Diagnosis not present

## 2021-07-23 DIAGNOSIS — M79606 Pain in leg, unspecified: Secondary | ICD-10-CM | POA: Diagnosis not present

## 2021-07-23 DIAGNOSIS — M25572 Pain in left ankle and joints of left foot: Secondary | ICD-10-CM | POA: Diagnosis not present

## 2021-07-23 DIAGNOSIS — M79672 Pain in left foot: Secondary | ICD-10-CM | POA: Diagnosis not present

## 2021-07-23 DIAGNOSIS — M79671 Pain in right foot: Secondary | ICD-10-CM | POA: Diagnosis not present

## 2021-07-23 DIAGNOSIS — M1990 Unspecified osteoarthritis, unspecified site: Secondary | ICD-10-CM | POA: Diagnosis not present

## 2021-07-23 DIAGNOSIS — M25561 Pain in right knee: Secondary | ICD-10-CM | POA: Diagnosis not present

## 2021-07-23 DIAGNOSIS — R634 Abnormal weight loss: Secondary | ICD-10-CM | POA: Diagnosis not present

## 2021-07-31 DIAGNOSIS — R634 Abnormal weight loss: Secondary | ICD-10-CM | POA: Diagnosis not present

## 2021-07-31 DIAGNOSIS — M62838 Other muscle spasm: Secondary | ICD-10-CM | POA: Diagnosis not present

## 2021-07-31 DIAGNOSIS — M359 Systemic involvement of connective tissue, unspecified: Secondary | ICD-10-CM | POA: Diagnosis not present

## 2021-07-31 DIAGNOSIS — M79606 Pain in leg, unspecified: Secondary | ICD-10-CM | POA: Diagnosis not present

## 2021-07-31 DIAGNOSIS — M1990 Unspecified osteoarthritis, unspecified site: Secondary | ICD-10-CM | POA: Diagnosis not present

## 2021-07-31 DIAGNOSIS — R5383 Other fatigue: Secondary | ICD-10-CM | POA: Diagnosis not present

## 2021-07-31 DIAGNOSIS — M549 Dorsalgia, unspecified: Secondary | ICD-10-CM | POA: Diagnosis not present

## 2022-02-19 ENCOUNTER — Other Ambulatory Visit (HOSPITAL_COMMUNITY): Payer: Self-pay | Admitting: Family Medicine

## 2022-02-19 DIAGNOSIS — R7989 Other specified abnormal findings of blood chemistry: Secondary | ICD-10-CM

## 2022-02-25 ENCOUNTER — Encounter (HOSPITAL_COMMUNITY): Payer: Self-pay

## 2022-02-25 ENCOUNTER — Ambulatory Visit (HOSPITAL_COMMUNITY): Admission: RE | Admit: 2022-02-25 | Payer: Medicare PPO | Source: Ambulatory Visit

## 2022-03-03 ENCOUNTER — Other Ambulatory Visit (HOSPITAL_COMMUNITY): Payer: Self-pay | Admitting: Family Medicine

## 2022-03-03 DIAGNOSIS — E785 Hyperlipidemia, unspecified: Secondary | ICD-10-CM

## 2022-03-08 ENCOUNTER — Other Ambulatory Visit (HOSPITAL_COMMUNITY): Payer: Medicare PPO

## 2022-03-10 ENCOUNTER — Ambulatory Visit (HOSPITAL_COMMUNITY)
Admission: RE | Admit: 2022-03-10 | Discharge: 2022-03-10 | Disposition: A | Discharge status: Home / Self Care | Payer: Medicare PPO | Source: Ambulatory Visit | Attending: Family Medicine | Admitting: Family Medicine

## 2022-03-10 DIAGNOSIS — F17210 Nicotine dependence, cigarettes, uncomplicated: Secondary | ICD-10-CM | POA: Diagnosis not present

## 2022-03-10 DIAGNOSIS — I5030 Unspecified diastolic (congestive) heart failure: Secondary | ICD-10-CM | POA: Diagnosis not present

## 2022-03-10 DIAGNOSIS — I700 Atherosclerosis of aorta: Secondary | ICD-10-CM | POA: Diagnosis not present

## 2022-03-10 DIAGNOSIS — E785 Hyperlipidemia, unspecified: Secondary | ICD-10-CM | POA: Insufficient documentation

## 2022-03-10 LAB — ECHOCARDIOGRAM COMPLETE
Area-P 1/2: 3.46 cm2
S' Lateral: 3.1 cm
